# Patient Record
Sex: Male | Born: 1980
Health system: Southern US, Community
[De-identification: ages and names within clinical notes are randomized; demographics above are authoritative.]

## PROBLEM LIST (undated history)

## (undated) DIAGNOSIS — T7840XA Allergy, unspecified, initial encounter: Secondary | ICD-10-CM

## (undated) DIAGNOSIS — F419 Anxiety disorder, unspecified: Secondary | ICD-10-CM

## (undated) DIAGNOSIS — F329 Major depressive disorder, single episode, unspecified: Secondary | ICD-10-CM

## (undated) DIAGNOSIS — F32A Depression, unspecified: Secondary | ICD-10-CM

## (undated) HISTORY — DX: Depression, unspecified: F32.A

## (undated) HISTORY — DX: Allergy, unspecified, initial encounter: T78.40XA

## (undated) HISTORY — DX: Major depressive disorder, single episode, unspecified: F32.9

## (undated) HISTORY — DX: Anxiety disorder, unspecified: F41.9

## (undated) HISTORY — PX: VASECTOMY: SHX75

---

## 2011-11-11 ENCOUNTER — Ambulatory Visit (INDEPENDENT_AMBULATORY_CARE_PROVIDER_SITE_OTHER): Payer: Managed Care, Other (non HMO) | Admitting: Family Medicine

## 2011-11-11 ENCOUNTER — Encounter: Payer: Self-pay | Admitting: Family Medicine

## 2011-11-11 DIAGNOSIS — F329 Major depressive disorder, single episode, unspecified: Secondary | ICD-10-CM

## 2011-11-11 MED ORDER — BUPROPION HCL ER (SR) 150 MG PO TB12: 150.0000 mg | ORAL_TABLET | Freq: Every day | ORAL | Status: DC

## 2011-11-11 NOTE — Patient Instructions (Signed)
I would get a flu shot each fall.   You can call back about getting a flu shot and a tdap (tetanus). Start back on the wellbutrin and if you aren't improved, then let me know.   Glad to see you.  We'll request your records from [facility].

## 2011-11-13 ENCOUNTER — Telehealth: Payer: Self-pay | Admitting: *Deleted

## 2011-11-13 ENCOUNTER — Encounter: Payer: Self-pay | Admitting: Family Medicine

## 2011-11-13 DIAGNOSIS — F329 Major depressive disorder, single episode, unspecified: Secondary | ICD-10-CM | POA: Insufficient documentation
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
## 2011-11-13 NOTE — Telephone Encounter (Signed)
I'll address the hard copy upon return to the office.

## 2011-11-13 NOTE — Telephone Encounter (Signed)
Received a fax from pharmacy requesting additional information before approval will be approved/denied for Bupropion HCL SR.  Form in your IN box.

## 2011-11-13 NOTE — Assessment & Plan Note (Signed)
Requesting records, no SI/HI.  Continue cymbalta, rx given for wellbutrin, 2 copies, 1 for local to get in meantime while mailorder arrives.  Contracts for safety, return for flu/tdap vaccines.  He agrees, will notify me if he doesn't improve on the wellbutrin.  Okay for outpatient f/u.

## 2012-05-06 ENCOUNTER — Ambulatory Visit (INDEPENDENT_AMBULATORY_CARE_PROVIDER_SITE_OTHER): Payer: Managed Care, Other (non HMO) | Admitting: Psychology

## 2012-05-06 DIAGNOSIS — F331 Major depressive disorder, recurrent, moderate: Secondary | ICD-10-CM

## 2012-06-02 ENCOUNTER — Ambulatory Visit (INDEPENDENT_AMBULATORY_CARE_PROVIDER_SITE_OTHER): Payer: Managed Care, Other (non HMO) | Admitting: Family Medicine

## 2012-06-02 ENCOUNTER — Encounter: Payer: Self-pay | Admitting: *Deleted

## 2012-06-02 ENCOUNTER — Encounter: Payer: Self-pay | Admitting: Family Medicine

## 2012-06-02 VITALS — BP 126/78 | HR 80 | Temp 98.1°F | Wt 252.8 lb

## 2012-06-02 DIAGNOSIS — H6092 Unspecified otitis externa, left ear: Secondary | ICD-10-CM

## 2012-06-02 DIAGNOSIS — H60399 Other infective otitis externa, unspecified ear: Secondary | ICD-10-CM

## 2012-06-02 MED ORDER — HYDROCODONE-ACETAMINOPHEN 5-500 MG PO TABS
1.0000 | ORAL_TABLET | Freq: Three times a day (TID) | ORAL | Status: AC | PRN
Start: 2012-06-02 — End: 2012-06-12

## 2012-06-02 NOTE — Progress Notes (Signed)
  Subjective:    Patient ID: Jesse Melton, male    DOB: February 06, 1981, 31 y.o.   MRN: [id number]  HPI CC: L ear pain  sxs started Thursday afternoon - left earache, hearing loss, as well as drainage from ear.  No congestion, RN, sneezing, cough.  + PNDrainage.  + fever to 101.8 last night.  + tinnitus.  Seen at [facility] on Sunday, started on oflox ear drops and amoxicillin 875mg  bid x 10 days.  Told both middle and outer ear infection.  Concern for TM perforation.    Yesterday at work earache got worse - unable to go to second job.  Took old script of klonopin   No h/o ear infections in past.    Has recently been swimming - at [employer] last week.  Washed ear on Saturday with peroxide and water.  Daughter sick recently with cold  No h/o DM.  + mother with h/o DM.  Taking 800mg  ibuprofen and 1gm tylenol  Smoker.  Review of Systems Per HPI    Objective:   Physical Exam  Nursing note and vitals reviewed. Constitutional: He appears well-developed and well-nourished. No distress.  HENT:  Head: Normocephalic and atraumatic.  Right Ear: Hearing, tympanic membrane, external ear and ear canal normal.  Left Ear: Decreased hearing is noted.  Nose: Nose normal. Right sinus exhibits no maxillary sinus tenderness and no frontal sinus tenderness. Left sinus exhibits no maxillary sinus tenderness and no frontal sinus tenderness.  Mouth/Throat: Oropharynx is clear and moist. No oropharyngeal exudate.       L TM unable to fully visualize, some cloudy and erythematous L external ear canal swollen, very tender and with debris present. L outer ear - pinna, tragus tender to palpation and swollen No mastoid tenderness + L posterior auricular tenderness  Eyes: Conjunctivae and EOM are normal. Pupils are equal, round, and reactive to light. No scleral icterus.  Neck: Normal range of motion. Neck supple.  Lymphadenopathy:    He has no cervical adenopathy.       Assessment & Plan:

## 2012-06-02 NOTE — Patient Instructions (Signed)
You have continued external ear infection. Finish oral antibiotic and ear drops - 10 drops into ear daily for 7 days Continue ibuprofen - 800 mg up to 3 times a day. Take vicodin for breakthrough pain - stop tylenol  Otitis Externa Otitis externa ("swimmer's ear") is a germ (bacterial) or fungal infection of the outer ear canal (from the eardrum to the outside of the ear). Swimming in dirty water may cause swimmer's ear. It also may be caused by moisture in the ear from water remaining after swimming or bathing. Often the first signs of infection may be itching in the ear canal. This may progress to ear canal swelling, redness, and pus drainage, which may be signs of infection. HOME CARE INSTRUCTIONS   Apply the antibiotic drops to the ear canal as prescribed by your doctor.   This can be a very painful medical condition. A strong pain reliever may be prescribed.   Only take over-the-counter or prescription medicines for pain, discomfort, or fever as directed by your caregiver.   If your caregiver has given you a follow-up appointment, it is very important to keep that appointment. Not keeping the appointment could result in a chronic or permanent injury, pain, hearing loss and disability. If there is any problem keeping the appointment, you must call back to this facility for assistance.  PREVENTION   It is important to keep your ear dry. Use the corner of a towel to wick water out of the ear canal after swimming or bathing.   Avoid scratching in your ear. This can damage the ear canal or remove the protective wax lining the canal and make it easier for germs (bacteria) or a fungus to grow.   You may use ear drops made of rubbing alcohol and vinegar after swimming to prevent future "swimmer's ear" infections. Make up a small bottle of equal parts white vinegar and alcohol. Put 3 or 4 drops into each ear after swimming.   Avoid swimming in lakes, polluted water, or poorly chlorinated pools.    SEEK MEDICAL CARE IF:   An oral temperature above 102 F (38.9 C) develops.   Your ear is still painful after 3 days and shows signs of getting worse (redness, swelling, pain, or pus).  MAKE SURE YOU:   Understand these instructions.   Will watch your condition.   Will get help right away if you are not doing well or get worse.  Document Released: 11/04/2005 Document Revised: 10/24/2011 Document Reviewed: 06/10/2008 [facility] Patient Information 2012 [place], [place].

## 2012-06-02 NOTE — Assessment & Plan Note (Signed)
Doubt TM perf, however unable to clearly visualize and r/o Continue oral abx to cover AOM, continue ofloxacin for external component. Add on vicodin for pain. Update [place] if not improving as expected.

## 2012-06-04 ENCOUNTER — Ambulatory Visit (INDEPENDENT_AMBULATORY_CARE_PROVIDER_SITE_OTHER): Payer: Managed Care, Other (non HMO) | Admitting: Psychology

## 2012-06-04 DIAGNOSIS — F331 Major depressive disorder, recurrent, moderate: Secondary | ICD-10-CM

## 2012-07-02 ENCOUNTER — Ambulatory Visit: Payer: Managed Care, Other (non HMO) | Admitting: Psychology

## 2012-07-30 ENCOUNTER — Ambulatory Visit (INDEPENDENT_AMBULATORY_CARE_PROVIDER_SITE_OTHER): Payer: Managed Care, Other (non HMO) | Admitting: Psychology

## 2012-07-30 DIAGNOSIS — F331 Major depressive disorder, recurrent, moderate: Secondary | ICD-10-CM

## 2012-08-07 ENCOUNTER — Other Ambulatory Visit: Payer: Self-pay | Admitting: *Deleted

## 2012-08-07 NOTE — Telephone Encounter (Signed)
Faxed refill request   

## 2012-08-09 MED ORDER — BUPROPION HCL ER (SR) 150 MG PO TB12: 150.0000 mg | ORAL_TABLET | Freq: Every day | ORAL | Status: DC

## 2012-08-09 NOTE — Telephone Encounter (Signed)
Need OV this fall.

## 2012-08-10 NOTE — Telephone Encounter (Signed)
LMOVM of cell phone. 

## 2012-08-19 ENCOUNTER — Ambulatory Visit: Payer: Managed Care, Other (non HMO) | Admitting: Psychology

## 2012-08-21 ENCOUNTER — Other Ambulatory Visit: Payer: Self-pay | Admitting: *Deleted

## 2012-08-21 ENCOUNTER — Encounter: Payer: Self-pay | Admitting: Family Medicine

## 2012-08-21 ENCOUNTER — Ambulatory Visit (INDEPENDENT_AMBULATORY_CARE_PROVIDER_SITE_OTHER): Payer: Managed Care, Other (non HMO) | Admitting: Family Medicine

## 2012-08-21 VITALS — BP 142/74 | HR 85 | Temp 97.8°F | Wt 240.0 lb

## 2012-08-21 DIAGNOSIS — Z23 Encounter for immunization: Secondary | ICD-10-CM

## 2012-08-21 DIAGNOSIS — Z0000 Encounter for general adult medical examination without abnormal findings: Secondary | ICD-10-CM | POA: Insufficient documentation

## 2012-08-21 DIAGNOSIS — Z3009 Encounter for other general counseling and advice on contraception: Secondary | ICD-10-CM

## 2012-08-21 DIAGNOSIS — F329 Major depressive disorder, single episode, unspecified: Secondary | ICD-10-CM

## 2012-08-21 NOTE — Patient Instructions (Addendum)
See Shirlee Limerick about your referral before you leave today. Take care.  Don't change your meds.  When you need a refill, notify us (or have the pharmacy send notice).   Recheck in 1 year.  If you want help to stop smoking, notify us.

## 2012-08-21 NOTE — Progress Notes (Signed)
Depressed Mood:  Sleep decreased/ Insomnia/Early awakening: working 3rd shift, but sleep is reasonable Interest decreased in activities: no Guilt or worthlessness:no Energy decreased: only from work and family requirements Concentration difficulties: no Appetite disturbance or weight loss: no Psychomotor retardation/agitation:no Suicidal thoughts: no Overall improved on only wellbutrin.  Is off cymbalta.  Still in counseling with Dr. Laymond Purser, ~1x/month Working 3rd shift at [employer] with a part time second job.    Wanted referral for urology- wants a vasectomy.  Needs a tetanus shot.   Meds, vitals, and allergies reviewed.   ROS: See HPI.  Otherwise negative.    NAD Speech fluent Judgement intact Affect wnl NCAT RRR CTAB No tremor

## 2012-08-21 NOTE — Assessment & Plan Note (Signed)
Referral placed.

## 2012-08-21 NOTE — Assessment & Plan Note (Signed)
Doing well on only wellbutrin.  Continue as is.  Will notify me if his condition changes.  Okay for outpatient f/u.  He is continuing with counseling.

## 2013-01-16 ENCOUNTER — Encounter (HOSPITAL_COMMUNITY): Payer: Self-pay | Admitting: Emergency Medicine

## 2013-01-16 ENCOUNTER — Emergency Department (HOSPITAL_COMMUNITY)
Admission: EM | Admit: 2013-01-16 | Discharge: 2013-01-16 | Disposition: A | Discharge status: Home / Self Care | Payer: Managed Care, Other (non HMO) | Attending: Emergency Medicine | Admitting: Emergency Medicine

## 2013-01-16 DIAGNOSIS — S01312A Laceration without foreign body of left ear, initial encounter: Secondary | ICD-10-CM

## 2013-01-16 DIAGNOSIS — S01309A Unspecified open wound of unspecified ear, initial encounter: Secondary | ICD-10-CM | POA: Insufficient documentation

## 2013-01-16 DIAGNOSIS — Y9289 Other specified places as the place of occurrence of the external cause: Secondary | ICD-10-CM | POA: Insufficient documentation

## 2013-01-16 DIAGNOSIS — Y990 Civilian activity done for income or pay: Secondary | ICD-10-CM | POA: Insufficient documentation

## 2013-01-16 DIAGNOSIS — Y939 Activity, unspecified: Secondary | ICD-10-CM | POA: Insufficient documentation

## 2013-01-16 NOTE — ED Notes (Signed)
Pt states earring from L ear became lodge in car door while at work, earring pulled out of ear, laceration noted through earlobe

## 2013-01-16 NOTE — ED Provider Notes (Signed)
History     CSN: [id number]  Arrival date & time 01/16/13  0056   First MD Initiated Contact with Patient 01/16/13 0107      Chief Complaint  Patient presents with  . Ear Laceration    (Consider location/radiation/quality/duration/timing/severity/associated sxs/prior treatment) HPI History provided by pt.   Pt caught his earring on a car door at work, and it tore out through his left earlobe.  Occurred just pta.  Bleed heavily initially but currently controlled.  Pain is minimal.  Tetanus is up to date.   Past Medical History  Diagnosis Date  . Allergy   . Depression     much improved ~2012 with wellbutrin    History reviewed. No pertinent past surgical history.  Family History  Problem Relation Age of Onset  . Heart disease Mother   . Mental illness Mother   . Diabetes Mother   . Heart disease Father   . Colon cancer Neg Hx   . Prostate cancer Neg Hx     History  Substance Use Topics  . Smoking status: Current Some Day Smoker    Types: Cigars  . Smokeless tobacco: Current User    Types: Snuff  . Alcohol Use: Yes     Comment: Occasional      Review of Systems  All other systems reviewed and are negative.    Allergies  Review of patient's allergies indicates no known allergies.  Home Medications   Current Outpatient Rx  Name  Route  Sig  Dispense  Refill  . buPROPion (WELLBUTRIN SR) 150 MG 12 hr tablet   Oral   Take 1 tablet (150 mg total) by mouth daily.   90 tablet   1     BP 143/82  Pulse 71  Temp(Src) 98.3 F (36.8 C) (Oral)  Resp 20  Ht 5\' 10"  (1.778 m)  Wt 235 lb (106.595 kg)  BMI 33.72 kg/m2  SpO2 100%  Physical Exam  Nursing note and vitals reviewed. Constitutional: He is oriented to person, place, and time. He appears well-developed and well-nourished. No distress.  HENT:  Head: Normocephalic and atraumatic.  1.5cm hemostatic and clean lac through the left earlobe.    Eyes:  Normal appearance  Neck: Normal range of motion.   Pulmonary/Chest: Effort normal.  Musculoskeletal: Normal range of motion.  Neurological: He is alert and oriented to person, place, and time.  Psychiatric: He has a normal mood and affect. His behavior is normal.    ED Course  Procedures (including critical care time)  LACERATION REPAIR Performed by: Otilio Miu Authorized by: Ruby Cola E Consent: Verbal consent obtained. Risks and benefits: risks, benefits and alternatives were discussed Consent given by: patient Patient identity confirmed: provided demographic data Prepped and Draped in normal sterile fashion Wound explored  Laceration Location: left earlobe  Laceration Length: 2.5cm  No Foreign Bodies seen or palpated  Anesthesia: local infiltration  Local anesthetic: lidocaine 2% w/out epinephrine  Anesthetic total: 3 ml  Irrigation method: syringe Amount of cleaning: standard  Skin closure: prolene 6.0  Number of sutures: 8  Technique: simple interrupted  Patient tolerance: Patient tolerated the procedure well with no immediate complications.  Labs Reviewed - No data to display No results found.   1. Laceration of ear, left, initial encounter       MDM  31yo M presents w/ lac of left earlobe.  Nursing staff cleaned and I sutured.  Tetanus up to date.  Return precautions discussed. 2:55 AM  Otilio Miu, PA-C 01/16/13 [phone]

## 2013-01-16 NOTE — ED Provider Notes (Signed)
Medical screening examination/treatment/procedure(s) were performed by non-physician practitioner and as supervising physician I was immediately available for consultation/collaboration.   Amand Lemoine, MD 01/16/13 0550 

## 2013-03-01 ENCOUNTER — Other Ambulatory Visit: Payer: Self-pay | Admitting: Family Medicine

## 2013-03-01 NOTE — Telephone Encounter (Signed)
Electronic refill request.  This does not fit protocol for me to refill.  Please advise.

## 2013-03-01 NOTE — Telephone Encounter (Signed)
Okay to continue.  Sent.  

## 2013-09-02 ENCOUNTER — Other Ambulatory Visit: Payer: Self-pay | Admitting: Family Medicine

## 2013-09-02 NOTE — Telephone Encounter (Signed)
Electronic refill request. Patient not seen in some time.  Please advise.

## 2013-09-02 NOTE — Telephone Encounter (Signed)
Needs OV.  

## 2013-09-03 NOTE — Telephone Encounter (Signed)
Left detailed message on voicemail.  

## 2013-12-07 ENCOUNTER — Other Ambulatory Visit: Payer: Self-pay | Admitting: Family Medicine

## 2013-12-07 NOTE — Telephone Encounter (Signed)
Electronic refill request. Patient has not been seen since 2013.  Please advise.

## 2013-12-08 ENCOUNTER — Telehealth: Payer: Self-pay

## 2013-12-08 NOTE — Telephone Encounter (Signed)
Deny, needs OV first.

## 2013-12-08 NOTE — Telephone Encounter (Signed)
Opened in error--request already sent

## 2013-12-08 NOTE — Telephone Encounter (Deleted)
Last filled 09/03/13 #90--please advise

## 2013-12-10 ENCOUNTER — Encounter: Payer: Self-pay | Admitting: Family Medicine

## 2013-12-10 ENCOUNTER — Ambulatory Visit (INDEPENDENT_AMBULATORY_CARE_PROVIDER_SITE_OTHER): Payer: Managed Care, Other (non HMO) | Admitting: Family Medicine

## 2013-12-10 VITALS — BP 108/72 | HR 76 | Temp 98.1°F | Wt 233.5 lb

## 2013-12-10 DIAGNOSIS — F329 Major depressive disorder, single episode, unspecified: Secondary | ICD-10-CM

## 2013-12-10 MED ORDER — BUPROPION HCL ER (SR) 150 MG PO TB12: 150.0000 mg | ORAL_TABLET | Freq: Every day | ORAL | Status: DC

## 2013-12-10 NOTE — Patient Instructions (Signed)
Take care. Don't change your meds.  Call back as needed.

## 2013-12-10 NOTE — Progress Notes (Signed)
Pre-visit discussion using our clinic review tool. No additional management support is needed unless otherwise documented below in the visit note.  MDD prev on wellbutrin.  "It's the only thing that ever worked continually.  I don't want to stop it."  Not in counseling now, had done some prev.  It did help some previously.  Working 3rd shift, sleeping well.  Concentration, attention wnl.  No SI/HI, not tearful.  Occ "bad days" but not SI/HI and much improved on meds.  No ADE.  Using E cig.  Etoh, occ, on the weekends.  No drugs.  Occ takes a second dose on long work days.   Meds, vitals, and allergies reviewed.   ROS: See HPI.  Otherwise, noncontributory.

## 2013-12-10 NOTE — Assessment & Plan Note (Signed)
Stable, doing well.  Continue as is.  F/u prn.  No indication to change med.

## 2014-02-04 ENCOUNTER — Telehealth: Payer: Self-pay

## 2014-02-04 NOTE — Telephone Encounter (Signed)
Pt said when picked up bupropion in 11/2013 pt just realized quantity was # 60. Pt wanted to get #120. [facility] he can refill # 120. Pt notified and voiced understanding.

## 2014-12-27 ENCOUNTER — Other Ambulatory Visit: Payer: Self-pay

## 2014-12-27 NOTE — Telephone Encounter (Signed)
Pt left v/m requesting refill bupropion for 90 day supply to [facility]. Pt last seen 12/10/13 and has appt for med refill scheduled on 12/30/14.pt will run out before appt on 12/30/14.Please advise. Pt request cb.

## 2014-12-28 MED ORDER — BUPROPION HCL ER (SR) 150 MG PO TB12: 150.0000 mg | ORAL_TABLET | Freq: Every day | ORAL | Status: DC

## 2014-12-28 NOTE — Telephone Encounter (Signed)
Sent, thanks

## 2014-12-28 NOTE — Telephone Encounter (Signed)
Left message on voice mail  to call back

## 2014-12-28 NOTE — Telephone Encounter (Signed)
Patient notified by telephone that script has been sent to the pharmacy. 

## 2014-12-30 ENCOUNTER — Encounter: Payer: Self-pay | Admitting: Family Medicine

## 2014-12-30 ENCOUNTER — Ambulatory Visit (INDEPENDENT_AMBULATORY_CARE_PROVIDER_SITE_OTHER): Payer: Managed Care, Other (non HMO) | Admitting: Family Medicine

## 2014-12-30 VITALS — BP 104/70 | HR 74 | Temp 97.7°F | Wt 221.2 lb

## 2014-12-30 DIAGNOSIS — F3342 Major depressive disorder, recurrent, in full remission: Secondary | ICD-10-CM

## 2014-12-30 MED ORDER — BUPROPION HCL ER (SR) 150 MG PO TB12: 150.0000 mg | ORAL_TABLET | Freq: Every day | ORAL | Status: DC

## 2014-12-30 NOTE — Assessment & Plan Note (Signed)
Doing well, continue as is.  F/u prn.  He agrees.

## 2014-12-30 NOTE — Patient Instructions (Signed)
Take care.  Glad to see you.  I would get a flu shot each fall.   Call me if needed.  I'd like to see you back in about 1 year.

## 2014-12-30 NOTE — Progress Notes (Signed)
Pre visit review using our clinic review tool, if applicable. No additional management support is needed unless otherwise documented below in the visit note.  MDD prev on wellbutrin. "It's the only thing that ever worked continually. I don't want to stop it." Had done counseling prev. It did help some previously. Working 3rd shift, sleeping well. Concentration, attention wnl. No SI/HI, not tearful. Rare "bad days" but not SI/HI and much improved on meds. No ADE. Using E cig, tapering off. Etoh, occ, on the weekends. No drugs. Occ takes a second dose on long work days, usually about 1-2 times a week.    Meds, vitals, and allergies reviewed.   ROS: See HPI.  Otherwise, noncontributory.  GEN: nad, alert and oriented, speech and affect wnl.  HEENT: mucous membranes moist NECK: supple w/o LA CV: rrr.  no murmur PULM: ctab, no inc wob ABD: soft, +bs EXT: no edema

## 2015-08-17 ENCOUNTER — Other Ambulatory Visit: Payer: Self-pay

## 2015-08-17 NOTE — Telephone Encounter (Addendum)
Pt left v/m requesting change in instructions and quantity of wellbutrin. Pt has recently started day shift and pt needs to take the wellbutrin SR 150 mg twice a day. Pt request instructions changed to bid and quantity changed to # 180.Please advise. Pt last seen 12/30/14.  Pt request cb when refill done.Unable to reach pt to verify what pharmacy pt wants med to go to. Pt called back and request med to [facility].

## 2015-08-18 MED ORDER — BUPROPION HCL ER (SR) 150 MG PO TB12: 150.0000 mg | ORAL_TABLET | Freq: Two times a day (BID) | ORAL | Status: DC

## 2015-08-18 NOTE — Telephone Encounter (Signed)
Sent. Thanks.   

## 2015-08-18 NOTE — Telephone Encounter (Signed)
Pt called back to ck on status of refill; spoke with [facility] and refill ready for pick up; apologized to pt he did not get cb but rx ready now. Pt voiced understanding.

## 2016-02-17 ENCOUNTER — Other Ambulatory Visit: Payer: Self-pay | Admitting: Family Medicine

## 2016-02-19 NOTE — Telephone Encounter (Signed)
Received refill electronically Last refill 08/18/15 #180/1 refill Last office visit 12/30/14 Is it okay to refill?

## 2016-02-19 NOTE — Telephone Encounter (Signed)
Sent.  Needs 15 min OV when possible.  Thanks.

## 2016-02-20 NOTE — Telephone Encounter (Signed)
Left detailed message on voicemail.  

## 2016-05-16 ENCOUNTER — Other Ambulatory Visit: Payer: Self-pay | Admitting: Family Medicine

## 2016-05-16 NOTE — Telephone Encounter (Signed)
Electronic refill request. Last office visit:   12/30/2014.  Last Filled:    180 tablet 0 02/19/2016  Please advise.  No upcoming appts scheduled.

## 2016-05-16 NOTE — Telephone Encounter (Signed)
Needs OV when possible.  Sent.  Thanks.

## 2016-05-17 NOTE — Telephone Encounter (Signed)
Left detailed message on voicemail.  

## 2016-08-12 ENCOUNTER — Other Ambulatory Visit: Payer: Self-pay | Admitting: Family Medicine

## 2016-11-07 ENCOUNTER — Other Ambulatory Visit: Payer: Self-pay | Admitting: Family Medicine

## 2016-11-07 NOTE — Telephone Encounter (Signed)
Spoke to pt. Made OV 12-13-16.

## 2016-12-13 ENCOUNTER — Ambulatory Visit (INDEPENDENT_AMBULATORY_CARE_PROVIDER_SITE_OTHER): Payer: Managed Care, Other (non HMO) | Admitting: Family Medicine

## 2016-12-13 ENCOUNTER — Encounter: Payer: Self-pay | Admitting: Family Medicine

## 2016-12-13 DIAGNOSIS — F3342 Major depressive disorder, recurrent, in full remission: Secondary | ICD-10-CM | POA: Diagnosis not present

## 2016-12-13 MED ORDER — BUPROPION HCL ER (SR) 150 MG PO TB12: ORAL_TABLET | ORAL | 0 refills | Status: DC

## 2016-12-13 NOTE — Progress Notes (Signed)
Pre visit review using our clinic review tool, if applicable. No additional management support is needed unless otherwise documented below in the visit note. 

## 2016-12-13 NOTE — Progress Notes (Signed)
F/u depression.  On wellbutrin, with relief of mood sx.  Sleeping well.  Exercise- running and lifting.  He has some dec in concentration about 1-2 hours after a dose of wellbutrin.  Noted in the last year or so.  Asking about options.  No SI/HI.    His daughter has anxiety and is in counseling, with some relief.    Meds, vitals, and allergies reviewed.   ROS: Per HPI unless specifically indicated in ROS section   GEN: nad, alert and oriented HEENT: mucous membranes moist NECK: supple w/o LA CV: rrr.  no murmur PULM: ctab, no inc wob ABD: soft, +bs EXT: no edema

## 2016-12-13 NOTE — Patient Instructions (Signed)
Let me consider options about the wellbutrin and we'll be in touch.   Take care.  Glad to see you.

## 2016-12-15 ENCOUNTER — Telehealth: Payer: Self-pay | Admitting: Family Medicine

## 2016-12-15 MED ORDER — BUPROPION HCL 100 MG PO TABS: 100.0000 mg | ORAL_TABLET | Freq: Three times a day (TID) | ORAL | 1 refills | Status: DC

## 2016-12-15 NOTE — Assessment & Plan Note (Signed)
No SI/HI but noting changes in concentration after med dose.  D/w pt.   We can consider med change vs dose change/reduction, ie wellbutrin 100mg  in the AM then 150mg  in the PM vs 100 tid.   Had prev used prozac, zoloft, cymbalta w/o relief.   Never on venlafaxine prev.   I want to consider.  >25 minutes spent in face to face time with patient, >50% spent in counselling or coordination of care.  See following note.

## 2016-12-15 NOTE — Telephone Encounter (Signed)
Call pt.  Did some consideration.  Would try 100mg  wellbutrin TID, rx sent.  Try that and update me in about 1-2 weeks, sooner if needed.  We'll go from there.  Thanks.

## 2016-12-16 NOTE — Telephone Encounter (Signed)
Left message on voice mail  to call back

## 2016-12-16 NOTE — Telephone Encounter (Signed)
Patient notified as instructed by telephone and verbalized understanding. 

## 2017-01-17 ENCOUNTER — Other Ambulatory Visit: Payer: Self-pay

## 2017-01-17 MED ORDER — BUPROPION HCL 100 MG PO TABS: 100.0000 mg | ORAL_TABLET | Freq: Three times a day (TID) | ORAL | 3 refills | Status: DC

## 2017-01-17 NOTE — Telephone Encounter (Signed)
Pt called checking on rx  He stated he is out of his meds Please advise

## 2017-01-17 NOTE — Telephone Encounter (Signed)
Pt left v/m due to ins guidelines pt needs to get a 90 day rx for bupropion 100 mg sent to [facility]. Last seen 12/13/16. Last refilled # 90 x 1 on 12/15/16.No future appt scheduled.Please advise.

## 2017-01-17 NOTE — Telephone Encounter (Signed)
Called pt.  Doing well with current med/dose and we wanted to continue.  Clearly an improvement from prev.  Rx sent.  He'll update me as needed.

## 2017-02-11 ENCOUNTER — Ambulatory Visit: Payer: Managed Care, Other (non HMO) | Admitting: Family Medicine

## 2017-12-31 ENCOUNTER — Ambulatory Visit: Payer: 59 | Admitting: Family Medicine

## 2017-12-31 ENCOUNTER — Encounter: Payer: Self-pay | Admitting: Family Medicine

## 2017-12-31 VITALS — BP 120/88 | HR 66 | Temp 98.5°F | Wt 243.0 lb

## 2017-12-31 DIAGNOSIS — F3342 Major depressive disorder, recurrent, in full remission: Secondary | ICD-10-CM | POA: Diagnosis not present

## 2017-12-31 DIAGNOSIS — R300 Dysuria: Secondary | ICD-10-CM | POA: Diagnosis not present

## 2017-12-31 LAB — POCT URINALYSIS DIPSTICK
BILIRUBIN UA: NEGATIVE
Glucose, UA: NEGATIVE
KETONES UA: NEGATIVE
Leukocytes, UA: NEGATIVE
NITRITE UA: NEGATIVE
PH UA: 6 (ref 5.0–8.0)
Protein, UA: NEGATIVE
RBC UA: NEGATIVE
SPEC GRAV UA: 1.025 (ref 1.010–1.025)
UROBILINOGEN UA: 0.2 U/dL

## 2017-12-31 MED ORDER — BUPROPION HCL 100 MG PO TABS: 100.0000 mg | ORAL_TABLET | Freq: Three times a day (TID) | ORAL | 3 refills | Status: DC

## 2017-12-31 MED ORDER — SULFAMETHOXAZOLE-TRIMETHOPRIM 400-80 MG PO TABS: 1.0000 | ORAL_TABLET | Freq: Two times a day (BID) | ORAL | 0 refills | Status: DC

## 2017-12-31 NOTE — Progress Notes (Signed)
His daughter has anxiety and is in counseling, with some relief.    Still on wellbutrin at baseline and he is doing better on the current dose of 100mg  TID.  He didn't tolerate the 150mg  dosing as well, see prev notes.  He is soon to get a promotion at work and that is a good thing for him.  No SI/HI.   When he was 37 y/o he had a prostate infection and was on abx for 4 weeks, he was told to expect return of sx at some point.  He has had episodic return of sx, every year or two.  He would put up with it and it would eventually resolve.  He is on week 5 now with this flare.  Burning with urination.  Urgency.  No FCNAV.  He doesn't have as much pain when he isn't urinating.    Some occ R testicle pain.  No discharge.  Monogamous.  Prev neg STD testing.  Cutting out caffeine helped minimally.    Meds, vitals, and allergies reviewed.   ROS: Per HPI unless specifically indicated in ROS section   nad ncat Mmm Neck supple, no LA rrr ctab abd soft, not ttp, no cva pain Ext w/o edema.  Prostate not ttp  Testes bilaterally descended without nodularity, tenderness or masses. No scrotal masses or lesions. No penis lesions or urethral discharge. Affect wnl.

## 2017-12-31 NOTE — Patient Instructions (Signed)
Presumed non-severe prostatitis.  Start septra. We'll contact you with your lab report. Take care.  Glad to see you.  Update me if not getting better or if worse.

## 2017-12-31 NOTE — Assessment & Plan Note (Signed)
Controlled, continue current medication.  Continue as is.  He will update me as needed.  No suicidal or homicidal intent.

## 2017-12-31 NOTE — Assessment & Plan Note (Signed)
Check urine culture.  He has reportedly had episodes like this over the years.  He reports previously being diagnosed with prostatitis.  His rectal exam is unremarkable at this point.  The prostate is not boggy and not tender.  He is monogamous with negative STD testing previously.  Given his history it probably is reasonable to go ahead and treat him.  Discussed.  I question if he could have subclinical symptoms that were causing dysuria but not causing frank tenderness on rectal exam.  He will update me if not getting better.  At this point still okay for outpatient follow-up.  No CVA pain.  No fever.

## 2018-01-01 LAB — URINE CULTURE
MICRO NUMBER: 90193447
Result:: NO GROWTH
SPECIMEN QUALITY:: ADEQUATE

## 2019-01-01 DIAGNOSIS — J029 Acute pharyngitis, unspecified: Secondary | ICD-10-CM | POA: Diagnosis not present

## 2019-01-01 DIAGNOSIS — H6501 Acute serous otitis media, right ear: Secondary | ICD-10-CM | POA: Diagnosis not present

## 2019-01-06 ENCOUNTER — Other Ambulatory Visit: Payer: Self-pay | Admitting: Family Medicine

## 2019-01-07 NOTE — Telephone Encounter (Signed)
Electronic refill request. Bupropion Last office visit:   12/31/17 Acute, prior to that 12/13/16 Last Filled:    270 tablet 3 12/31/2017  Please advise.

## 2019-01-08 NOTE — Telephone Encounter (Signed)
Patient advised.

## 2019-01-08 NOTE — Telephone Encounter (Signed)
Please schedule OV when possible, after flu season is fine with me.  Sent.  Thanks.

## 2019-06-08 ENCOUNTER — Ambulatory Visit: Payer: BC Managed Care – PPO | Admitting: Family Medicine

## 2019-06-08 ENCOUNTER — Other Ambulatory Visit: Payer: Self-pay

## 2019-06-08 ENCOUNTER — Encounter: Payer: Self-pay | Admitting: Family Medicine

## 2019-06-08 VITALS — BP 110/74 | HR 83 | Temp 98.7°F | Ht 70.0 in | Wt 227.4 lb

## 2019-06-08 DIAGNOSIS — F339 Major depressive disorder, recurrent, unspecified: Secondary | ICD-10-CM

## 2019-06-08 DIAGNOSIS — R4586 Emotional lability: Secondary | ICD-10-CM | POA: Diagnosis not present

## 2019-06-08 NOTE — Progress Notes (Signed)
Pandemic consideration d/w pt.  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  He is full time now.    Mood d/w pt.  Still on wellbutrin at baseline which had prev helped.  No ADE on short acting med, but he did not tolerate the sustained release formulation as well.  His stress level is higher in the last few months given all of the recent changes.  His mood is worse given the stressors.  He has mood elevations, with following lowering of mood.  No SI/HI.    He doesn't have a firm dx of BAD but with elevations in mood he'll have about 1 day of elevated energy, lack of sleep, spending money.  Followed by ~3 days with much lower mood.  These episodes have been happening about once a week.    His child Addie is nonbinary and she is seeing a Social worker about that.   FH- mother is bipolar.    No psychotic thoughts, etc.  No illicits.  Some etoh on the weekends, 4 drinks a day on the weekends.    Prev was on prozac and zoloft and cymbalta, without relief.  PMH and SH reviewed  ROS: Per HPI unless specifically indicated in ROS section   Meds, vitals, and allergies reviewed.   GEN: nad, alert and oriented HEENT:ncat NECK: supple w/o LA CV: rrr.  PULM: ctab, no inc wob ABD: soft, +bs EXT: no edema SKIN: no acute rash Speech and judgment normal.  Affect is slightly flat.

## 2019-06-08 NOTE — Patient Instructions (Signed)
Go to the lab on the way out.  We'll contact you with your lab report. Let me see about options in the meantime for a med along with the wellbutrin.  We'll be in touch.  We'll see about getting you set up with psychiatry.   Take care.  Glad to see you.

## 2019-06-09 LAB — CBC WITH DIFFERENTIAL/PLATELET
Basophils Absolute: 0.1 10*3/uL (ref 0.0–0.1)
Basophils Relative: 1.2 % (ref 0.0–3.0)
Eosinophils Absolute: 0.1 10*3/uL (ref 0.0–0.7)
Eosinophils Relative: 1.4 % (ref 0.0–5.0)
HCT: 44.6 % (ref 39.0–52.0)
Hemoglobin: 15.1 g/dL (ref 13.0–17.0)
Lymphocytes Relative: 33.6 % (ref 12.0–46.0)
Lymphs Abs: 2.3 10*3/uL (ref 0.7–4.0)
MCHC: 33.7 g/dL (ref 30.0–36.0)
MCV: 89.9 fl (ref 78.0–100.0)
Monocytes Absolute: 0.6 10*3/uL (ref 0.1–1.0)
Monocytes Relative: 8.5 % (ref 3.0–12.0)
Neutro Abs: 3.7 10*3/uL (ref 1.4–7.7)
Neutrophils Relative %: 55.3 % (ref 43.0–77.0)
Platelets: 215 10*3/uL (ref 150.0–400.0)
RBC: 4.96 Mil/uL (ref 4.22–5.81)
RDW: 12.7 % (ref 11.5–15.5)
WBC: 6.8 10*3/uL (ref 4.0–10.5)

## 2019-06-09 LAB — COMPREHENSIVE METABOLIC PANEL
ALT: 16 U/L (ref 0–53)
AST: 13 U/L (ref 0–37)
Albumin: 4.4 g/dL (ref 3.5–5.2)
Alkaline Phosphatase: 52 U/L (ref 39–117)
BUN: 12 mg/dL (ref 6–23)
CO2: 29 mEq/L (ref 19–32)
Calcium: 9.4 mg/dL (ref 8.4–10.5)
Chloride: 106 mEq/L (ref 96–112)
Creatinine, Ser: 1.24 mg/dL (ref 0.40–1.50)
GFR: 65.95 mL/min (ref >60.00)
Glucose, Bld: 80 mg/dL (ref 70–99)
Potassium: 4.1 mEq/L (ref 3.5–5.1)
Sodium: 142 mEq/L (ref 135–145)
Total Bilirubin: 0.7 mg/dL (ref 0.2–1.2)
Total Protein: 6.6 g/dL (ref 6.0–8.3)

## 2019-06-09 LAB — TSH: TSH: 1.93 u[IU]/mL (ref 0.35–4.50)

## 2019-06-10 ENCOUNTER — Other Ambulatory Visit: Payer: Self-pay | Admitting: Family Medicine

## 2019-06-10 DIAGNOSIS — R4586 Emotional lability: Secondary | ICD-10-CM

## 2019-06-10 MED ORDER — BUPROPION HCL 100 MG PO TABS: 100.0000 mg | ORAL_TABLET | Freq: Three times a day (TID) | ORAL | 1 refills | Status: DC

## 2019-06-10 MED ORDER — QUETIAPINE FUMARATE 25 MG PO TABS: 25.0000 mg | ORAL_TABLET | Freq: Every day | ORAL | 0 refills | Status: DC

## 2019-06-10 MED ORDER — QUETIAPINE FUMARATE 25 MG PO TABS: 25.0000 mg | ORAL_TABLET | Freq: Every day | ORAL | Status: DC

## 2019-06-10 NOTE — Assessment & Plan Note (Signed)
He has been treated in the past as unilateral depression with relief with Wellbutrin.  He does have a family history of bipolar disorder.  His stress level is a lot higher given the pandemic, his family situation, etc.  We talked about tapering alcohol on the weekends.  He is not using illicits.  No suicidal homicidal intent.  Okay for outpatient follow-up.  He does have fluctuating mood and I am uncertain if he truly does have BAD.  I want to check his labs in the meantime and see about options.  We talked about potential psychiatry referral.  He agreed with the plan.  See follow-up notes and messaging.  >25 minutes spent in face to face time with patient, >50% spent in counselling or coordination of care.

## 2019-06-16 ENCOUNTER — Telehealth: Payer: Self-pay

## 2019-06-16 MED ORDER — QUETIAPINE FUMARATE 25 MG PO TABS: 50.0000 mg | ORAL_TABLET | Freq: Every day | ORAL | Status: DC

## 2019-06-16 NOTE — Telephone Encounter (Signed)
Pt left v/m not sure if can see any changes yet since taking med (pt did not name the med but ? Seroquel). Pt not had any adverse side effects and would like to stay on med for 1 -2 more weeks to see how he does.Please advise. Pt seen 06-08-19.

## 2019-06-16 NOTE — Telephone Encounter (Signed)
I am glad he was able to tolerate the medication.  I intentionally started him on a low dose of the medicine.  I would try going up to 2 tablets at night and see if he notices a difference with that.  I will also send a note to the referral coordinators to see about his appointment with psychiatry.  Thanks.

## 2019-06-17 NOTE — Telephone Encounter (Signed)
Left message for [place] to return call to office.

## 2019-06-22 ENCOUNTER — Telehealth: Payer: Self-pay | Admitting: Family Medicine

## 2019-06-22 NOTE — Telephone Encounter (Signed)
-----   Message from Haynes Bast sent at 06/18/2019  3:04 PM EDT ----- Donnald Garre called your patient twice so far and he hasnt returned my calls. I faxed over the Referral to Dr Varney Biles office but havent spoke to the patient yet. Rosaria Ferries ----- Message ----- From: Tonia Ghent, MD Sent: 06/16/2019   9:04 PM EDT To: Haynes Bast  When can patient see psychiatry?  Please let me know.  Thanks.  Brigitte Pulse

## 2019-06-22 NOTE — Telephone Encounter (Signed)
Pt aware of recs per Dr Damita Dunnings Pt states that he is tolerating the current dose well and has noticed a little difference. Pt is going to increase to 2 tablets nightly and see how he tolerates, he will call if any new issues or side effects arise.  He is okay with being referred to Psychiatry  Will send to Dr Damita Dunnings as Juluis Rainier

## 2019-06-23 NOTE — Telephone Encounter (Signed)
Noted. Thanks.  Referral is in.

## 2019-06-25 ENCOUNTER — Telehealth: Payer: Self-pay

## 2019-06-25 MED ORDER — ARIPIPRAZOLE 5 MG PO TABS: 5.0000 mg | ORAL_TABLET | Freq: Every day | ORAL | 0 refills | Status: DC

## 2019-06-25 NOTE — Telephone Encounter (Signed)
Pt said the seroquel 25 mg taking one pill at hs. Pt said dragging pt down so tired he has trouble functioning at work. Pt drinking large quantities of caffeine to function. Last night it affected pt to the point he could not do his job. NO SI/HI. When first started taking the seroquel it helped pt but not pt is so tired and for past wk pt is depressed. [facility] is talking with pt now about psych referral. Pt wants to know what he should do now. Dr Damita Dunnings out of office so sending to Dr Danise Mina.

## 2019-06-25 NOTE — Telephone Encounter (Signed)
Pt notified as instructed and pt voiced understanding and will pick up abilify and stop the seroquel 25 mg which pt was taking one tab at hs. Pt will cb and stop abilify if any SI/HI. FYI to Dr Damita Dunnings and Dr Danise Mina.

## 2019-06-25 NOTE — Telephone Encounter (Signed)
[place] Night - Client Nonclinical Telephone Record [facility] Client [place] Night - Client Client Site [facility] Physician Renford Dills - MD Contact Type Call Who Is Calling Patient / Member / Family / Caregiver Caller Name Adarryl Goldammer Caller Phone Number [phone] Patient Name Nihal Marzella Patient DOB [birth year] Call Type Message Only Information Provided Reason for Call Request for General Office Information Initial Comment Caller states he started a new medication, not working for him, and wondering what to do next, asking to speak with the MD. Refused triage. Additional Comment Provided information for a call back from the office. Call Closed By: Rosana Fret Transaction Date/Time: 06/25/2019 7:52:13 AM (ET)

## 2019-06-25 NOTE — Telephone Encounter (Signed)
Chart reviewed. Please verify he's taking 1 tablet of seroquel 25mg  nightly.  If so, stop seroquel if overly sedating on just 1 tab nightly (25mg  is the lowest dose available). In its place may try abilify 5mg  daily sent to pharmacy. If any SI/HI, stop med and let us know immediately.  Will cc PCP as Juluis Rainier

## 2019-06-27 NOTE — Telephone Encounter (Signed)
Agree, thanks.  Please get update on patient on Monday.   When can he see psychiatry?

## 2019-06-27 NOTE — Addendum Note (Signed)
Addended by: Tonia Ghent on: 06/27/2019 10:11 PM   Modules accepted: Orders

## 2019-06-28 NOTE — Telephone Encounter (Signed)
Feels better. Feel tired still but not like it was before medication change. No suicidal thoughts per patient. Appointment is set up for October with Dr. Johnney Killian was the first available appointment. Patient advised to let us know if he needs [place] in the meantime.

## 2019-06-29 NOTE — Telephone Encounter (Signed)
Noted. Thanks.

## 2019-07-17 ENCOUNTER — Other Ambulatory Visit: Payer: Self-pay | Admitting: Family Medicine

## 2019-07-19 NOTE — Telephone Encounter (Signed)
LOV 06/08/2019, no future appointments. Last filled on 06/25/2019 with no additional refills. Pharmacy sent request for 90 day supply if appropriate.

## 2019-07-20 NOTE — Telephone Encounter (Signed)
Spoke with patient. Patient states he is doing a lot better. Exercises regularly, takes this medication before bedtime. Overall doing good. Medication is managing symptoms at this time.

## 2019-07-20 NOTE — Telephone Encounter (Signed)
Sent. Thanks.  °Please get update on patient.   °

## 2019-07-21 NOTE — Telephone Encounter (Signed)
Noted. Thanks.

## 2019-08-02 ENCOUNTER — Telehealth: Payer: Self-pay

## 2019-08-02 DIAGNOSIS — Z1159 Encounter for screening for other viral diseases: Secondary | ICD-10-CM | POA: Diagnosis not present

## 2019-08-02 NOTE — Telephone Encounter (Signed)
[place] Night - Client TELEPHONE ADVICE RECORD [facility]urse Patient Name: Jesse Melton Gender: Male DOB: Jan 05, 1981 Age: 38 Y 2 M 7 D Return Phone Number: [phone] (Primary), [phone] (Secondary) [place] [address] Client [place] Night - Client Client Site [place] Physician Renford Dills - MD Contact Type Call Who Is Calling Patient / Member / Family / Caregiver Call Type Triage / Clinical Relationship To Patient Self Return Phone Number [phone] (Primary) Chief Complaint Vomiting Reason for Call Symptomatic / Request for [place] has been running a fever today along with vomiting and diarrhea, temp is currently 101.6, States he needs to get tested for work Translation No Nurse Assessment Nurse: Fredderick Phenix, RN, Lelan Pons Date/Time ([place] Time): 08/01/2019 12:17:31 PM Confirm and document reason for call. If symptomatic, describe symptoms. ---Caller that starting in the night, has vomiting 2x and diarrhea hourly, temp is currently 101.6 temporal. Very little urine, cant drink water. Has the patient had close contact with a person known or suspected to have the novel coronavirus illness OR traveled / lives in area with major community spread (including international travel) in the last 14 days from the onset of symptoms? * If Asymptomatic, screen for exposure and travel within the last 14 days. ---No Does the patient have any new or worsening symptoms? ---Yes Will a triage be completed? ---Yes Related visit to physician within the last 2 weeks? ---No Does the PT have any chronic conditions? (i.e. diabetes, asthma, this includes High risk factors for pregnancy, etc.) ---No Is this a behavioral health or substance abuse call? ---No Guidelines Guideline Title Affirmed Question Affirmed Notes Nurse Date/Time  (Eastern Time) Diarrhea Fever > 101 F (38.3 C) Ehlers, RN, Lelan Pons 08/01/2019 12:22:54 PM Disp. Time Eilene Ghazi Time) Disposition Final User 08/01/2019 12:27:32 PM See PCP within 24 Hours Yes Fredderick Phenix, RN, Lelan Pons PLEASE NOTE: All timestamps contained within this report are represented as [place] Standard Time. CONFIDENTIALTY NOTICE: This fax transmission is intended only for the addressee. It contains information that is legally privileged, confidential or otherwise protected from use or disclosure. If you are not the intended recipient, you are strictly prohibited from reviewing, disclosing, copying using or disseminating any of this information or taking any action in reliance on or regarding this information. If you have received this fax in error, please notify us immediately by telephone so that we can arrange for its return to [place]. Phone: [phone], Toll-Free: [phone], Fax: [phone] Page: 2 of 2 Call Id: [id number] [place] Disagree/Comply Comply Caller Understands Yes PreDisposition Did not know what to do Care Advice Given Per Guideline SEE PCP WITHIN 24 HOURS: * IF OFFICE WILL BE OPEN: You need to be seen within the next 24 hours. Call your doctor (or NP/PA) when the office opens and make an appointment. STOOL SAMPLE: It could be bacterial diarrhea. You may need to provide a stool culture. CLEAR FLUIDS: * Drink more fluids. * Sip water or a half-strength sports drink (Gatorade or Powerade; mix half and half with water) * Other options: oral rehydration solution (Pedialyte or Rehydralyte) . CALL BACK IF: * Signs of dehydration occur (e.g., no urine over 12 hours, very dry mouth, lightheaded, etc.) * Bloody stools * Constant or severe abdominal pain * You become worse. CARE ADVICE given per Diarrhea (Adult) guideline. Referrals REFERRED TO PCP OFFICE

## 2019-08-02 NOTE — Telephone Encounter (Signed)
Spoke with patient.  He was seen by [facility] today and obtained a COVID test.  Is at home under quarantine waiting for results.   Has nausea/vomitting/diarrhea but fever broke yesterday pm and he is starting to feel better.  No breathing difficulty reported.   He is aware of quarantine guidelines and that we cannot access urgent cares results or records.  I have asked him to please keep [place] updated as needed and if he needs anything in the meantime.   Urgent care reviewed quarantine, fluid push and rest instructions.   He thanks [place] for the call.

## 2019-08-03 NOTE — Telephone Encounter (Signed)
Noted. Thanks.

## 2019-11-05 ENCOUNTER — Other Ambulatory Visit: Payer: Self-pay | Admitting: Family Medicine

## 2019-11-05 DIAGNOSIS — F5105 Insomnia due to other mental disorder: Secondary | ICD-10-CM | POA: Diagnosis not present

## 2019-11-05 DIAGNOSIS — F4011 Social phobia, generalized: Secondary | ICD-10-CM | POA: Diagnosis not present

## 2019-11-05 DIAGNOSIS — F4312 Post-traumatic stress disorder, chronic: Secondary | ICD-10-CM | POA: Diagnosis not present

## 2019-11-05 DIAGNOSIS — F39 Unspecified mood [affective] disorder: Secondary | ICD-10-CM | POA: Diagnosis not present

## 2019-11-05 NOTE — Telephone Encounter (Signed)
Electronic refill request. Aripiprazole Last office visit:   06/08/2019 Last Filled:    90 tablet 0 07/20/2019  Please advise.

## 2019-11-07 ENCOUNTER — Telehealth: Payer: Self-pay | Admitting: Family Medicine

## 2019-11-07 DIAGNOSIS — R4586 Emotional lability: Secondary | ICD-10-CM

## 2019-11-07 NOTE — Telephone Encounter (Signed)
See below. Referral placed. Please contact patient.  Thanks.

## 2019-11-07 NOTE — Telephone Encounter (Signed)
-----   Message from Chauncey Mann, MD sent at 11/05/2019 10:54 AM EST ----- Thank you for the referral. I saw him today. Is there anyway you can put an internal referral in for him to see Debbe Bales, therapist at [facility]. I think he might be a good fit

## 2019-11-07 NOTE — Telephone Encounter (Signed)
Sent. Thanks.  Please get update on patient.  Was he able to see psychiatry?  How is he doing?

## 2019-11-08 NOTE — Telephone Encounter (Signed)
Noted. Thanks.  Glad to hear.  Will await the psych notes.

## 2019-11-08 NOTE — Telephone Encounter (Signed)
Patient had an appointment with Psychiatry this past Friday and it went well.  He has another appointment scheduled in January.  Patient says he is doing fine.

## 2019-11-26 DIAGNOSIS — F5105 Insomnia due to other mental disorder: Secondary | ICD-10-CM | POA: Diagnosis not present

## 2019-11-26 DIAGNOSIS — F4011 Social phobia, generalized: Secondary | ICD-10-CM | POA: Diagnosis not present

## 2019-11-26 DIAGNOSIS — F39 Unspecified mood [affective] disorder: Secondary | ICD-10-CM | POA: Diagnosis not present

## 2019-11-26 DIAGNOSIS — F4312 Post-traumatic stress disorder, chronic: Secondary | ICD-10-CM | POA: Diagnosis not present

## 2019-12-17 DIAGNOSIS — F4011 Social phobia, generalized: Secondary | ICD-10-CM | POA: Diagnosis not present

## 2019-12-17 DIAGNOSIS — F5105 Insomnia due to other mental disorder: Secondary | ICD-10-CM | POA: Diagnosis not present

## 2019-12-17 DIAGNOSIS — F39 Unspecified mood [affective] disorder: Secondary | ICD-10-CM | POA: Diagnosis not present

## 2019-12-17 DIAGNOSIS — F4312 Post-traumatic stress disorder, chronic: Secondary | ICD-10-CM | POA: Diagnosis not present

## 2019-12-31 ENCOUNTER — Ambulatory Visit: Payer: BC Managed Care – PPO | Admitting: Psychology

## 2020-01-07 DIAGNOSIS — F4312 Post-traumatic stress disorder, chronic: Secondary | ICD-10-CM | POA: Diagnosis not present

## 2020-01-07 DIAGNOSIS — F5105 Insomnia due to other mental disorder: Secondary | ICD-10-CM | POA: Diagnosis not present

## 2020-01-07 DIAGNOSIS — F39 Unspecified mood [affective] disorder: Secondary | ICD-10-CM | POA: Diagnosis not present

## 2020-01-07 DIAGNOSIS — F4011 Social phobia, generalized: Secondary | ICD-10-CM | POA: Diagnosis not present

## 2020-01-14 ENCOUNTER — Ambulatory Visit (INDEPENDENT_AMBULATORY_CARE_PROVIDER_SITE_OTHER): Payer: BC Managed Care – PPO | Admitting: Psychology

## 2020-01-14 DIAGNOSIS — F411 Generalized anxiety disorder: Secondary | ICD-10-CM | POA: Diagnosis not present

## 2020-01-21 DIAGNOSIS — F39 Unspecified mood [affective] disorder: Secondary | ICD-10-CM | POA: Diagnosis not present

## 2020-01-21 DIAGNOSIS — F411 Generalized anxiety disorder: Secondary | ICD-10-CM | POA: Diagnosis not present

## 2020-01-28 ENCOUNTER — Ambulatory Visit: Payer: BC Managed Care – PPO | Admitting: Psychology

## 2020-02-04 DIAGNOSIS — F39 Unspecified mood [affective] disorder: Secondary | ICD-10-CM | POA: Diagnosis not present

## 2020-02-04 DIAGNOSIS — F4312 Post-traumatic stress disorder, chronic: Secondary | ICD-10-CM | POA: Diagnosis not present

## 2020-02-04 DIAGNOSIS — F4011 Social phobia, generalized: Secondary | ICD-10-CM | POA: Diagnosis not present

## 2020-02-04 DIAGNOSIS — F5105 Insomnia due to other mental disorder: Secondary | ICD-10-CM | POA: Diagnosis not present

## 2020-02-11 ENCOUNTER — Ambulatory Visit: Payer: BC Managed Care – PPO | Admitting: Psychology

## 2020-04-21 ENCOUNTER — Ambulatory Visit: Payer: BC Managed Care – PPO | Admitting: Family Medicine

## 2020-04-21 ENCOUNTER — Ambulatory Visit (INDEPENDENT_AMBULATORY_CARE_PROVIDER_SITE_OTHER)
Admission: RE | Admit: 2020-04-21 | Discharge: 2020-04-21 | Disposition: A | Discharge status: Home / Self Care | Payer: BC Managed Care – PPO | Source: Ambulatory Visit | Attending: Family Medicine | Admitting: Family Medicine

## 2020-04-21 ENCOUNTER — Encounter: Payer: Self-pay | Admitting: Family Medicine

## 2020-04-21 ENCOUNTER — Other Ambulatory Visit: Payer: Self-pay

## 2020-04-21 VITALS — BP 116/70 | HR 81 | Temp 98.2°F | Ht 70.0 in | Wt 244.5 lb

## 2020-04-21 DIAGNOSIS — M5412 Radiculopathy, cervical region: Secondary | ICD-10-CM | POA: Diagnosis not present

## 2020-04-21 MED ORDER — PREDNISONE 20 MG PO TABS
ORAL_TABLET | ORAL | 0 refills | Status: DC
Start: 2020-04-21 — End: 2020-05-12

## 2020-04-21 MED ORDER — GABAPENTIN 100 MG PO CAPS: 100.0000 mg | ORAL_CAPSULE | Freq: Three times a day (TID) | ORAL | 0 refills | Status: DC

## 2020-04-21 MED ORDER — DEXAMETHASONE SODIUM PHOSPHATE 10 MG/ML IJ SOLN
10.0000 mg | Freq: Once | INTRAMUSCULAR | Status: AC
  Administered 2020-04-21: 10 mg via INTRAMUSCULAR

## 2020-04-21 MED ORDER — HYDROCODONE-ACETAMINOPHEN 5-325 MG PO TABS: 0.5000 | ORAL_TABLET | Freq: Three times a day (TID) | ORAL | 0 refills | Status: DC | PRN

## 2020-04-21 NOTE — Progress Notes (Signed)
This visit was conducted in person.  BP 116/70 (BP Location: Left Arm, Patient Position: Sitting, Cuff Size: Large)   Pulse 81   Temp 98.2 F (36.8 C) (Temporal)   Ht 5\' 10"  (1.778 m)   Wt 244 lb 8 oz (110.9 kg)   SpO2 96%   BMI 35.08 kg/m    CC: R neck pain Subjective:    Patient ID: Jesse Melton, male    DOB: 05-Mar-1981, 39 y.o.   MRN: [id number]  HPI: Jesse Melton is a 39 y.o. male presenting on 04/21/2020 for Neck Pain (C/o right side neck pain.  Occasionally radiates down right arm into thumb.  Started about 3 wks ago.  Noticed after resting weight bar on neck while lifting during exercise.  Tried ibuprofen, helpful.  Also, lifting right arm forward with bent elbow helps relieve pain. )   R-handed.  3-4 wk h/o R neck pain associated with R thumb numbness. Monday did swats at gym, placed weight bar on shoulder, acutely worsened pain now shooting down neck and shoulder into lateral upper arm. Last night severe pain to R neck with shooting pain down arm. Thumb staying numb, paresthesias. Notes R bicep>forearm weakness. Some numbness at R lateral elbow as well.   Raising arm above head provides some relief.  Ibuprofen 800mg  QID helps temporarily. Tylenol and aspirin didn't help much.      Relevant past medical, surgical, family and social history reviewed and updated as indicated. Interim medical history since our last visit reviewed. Allergies and medications reviewed and updated. Outpatient Medications Prior to Visit  Medication Sig Dispense Refill  . ARIPiprazole (ABILIFY) 5 MG tablet TAKE 1 TABLET BY MOUTH EVERY DAY 90 tablet 0  . buPROPion (WELLBUTRIN) 100 MG tablet TAKE 1 TABLET (100 MG TOTAL) BY MOUTH 3 (THREE) TIMES DAILY. 270 tablet 1  . venlafaxine XR (EFFEXOR-XR) 75 MG 24 hr capsule Take 75 mg by mouth every morning.     No facility-administered medications prior to visit.     Per HPI unless specifically indicated in ROS section below Review of Systems Objective:  BP  116/70 (BP Location: Left Arm, Patient Position: Sitting, Cuff Size: Large)   Pulse 81   Temp 98.2 F (36.8 C) (Temporal)   Ht 5\' 10"  (1.778 m)   Wt 244 lb 8 oz (110.9 kg)   SpO2 96%   BMI 35.08 kg/m   Wt Readings from Last 3 Encounters:  04/21/20 244 lb 8 oz (110.9 kg)  06/08/19 227 lb 6 oz (103.1 kg)  12/31/17 243 lb (110.2 kg)      Physical Exam Vitals and nursing note reviewed.  Constitutional:      Appearance: Normal appearance. He is obese. He is not ill-appearing.  Neck:     Comments: Keeps neck laterally flexed and rotated to the right Musculoskeletal:        General: Tenderness present. No swelling. Normal range of motion.     Cervical back: Neck supple.     Comments:  Mild discomfort to palpation midline cervical spine at C5/6, R trap discomfort   Lymphadenopathy:     Cervical: No cervical adenopathy.  Skin:    General: Skin is warm and dry.     Capillary Refill: Capillary refill takes less than 2 seconds.     Findings: No rash.  Neurological:     General: No focal deficit present.     Mental Status: He is alert.     Sensory: Sensory deficit present.  Motor: Motor function is intact. No weakness.     Comments:  + spurling test R>L 5/5 strength BUE with intact grip strength as well as intrinsic hand muscles Diminished reflexes BUE Diminished sensation to light touch and temperature at R thumb and mid forearm        Assessment & Plan:  This visit occurred during the SARS-CoV-2 public health emergency.  Safety protocols were in place, including screening questions prior to the visit, additional usage of staff PPE, and extensive cleaning of exam room while observing appropriate contact time as indicated for disinfecting solutions.  [place] CSRS reviewed.  Problem List Items Addressed This Visit    Right cervical radiculopathy - Primary    Story/exam consistent with presumed compressive R cervical radiculopathy with radicular pain along C5/6 distribution. Reviewed  dx this with patient. Treat with dexamethasone 10mg  IM today then start prednisone taper, tapering dose of gabapentin as tolerated, and vicodin for breakthrough pain. Reviewed possible adverse effects and side effects of medications prescribed.  Cervical films today.  Discussed anticipated course of recovery including need to avoid resistance training until symptoms improving. Discussed possible need for PT vs MRI pending recovery.  RTC 3 wks f/u with me or PCP. He will keep updated via mychart otherwise.       Relevant Medications   venlafaxine XR (EFFEXOR-XR) 75 MG 24 hr capsule   gabapentin (NEURONTIN) 100 MG capsule   Other Relevant Orders   DG Cervical Spine Complete       Meds ordered this encounter  Medications  . dexamethasone (DECADRON) injection 10 mg  . predniSONE (DELTASONE) 20 MG tablet    Sig: Take 3 tablets daily for 3 days followed by 2 tablets for 3 days followed by 1 tablet daily for 3 days    Dispense:  18 tablet    Refill:  0  . HYDROcodone-acetaminophen (NORCO/VICODIN) 5-325 MG tablet    Sig: Take 0.5-1 tablets by mouth 3 (three) times daily as needed for moderate pain.    Dispense:  15 tablet    Refill:  0  . gabapentin (NEURONTIN) 100 MG capsule    Sig: Take 1-3 capsules (100-300 mg total) by mouth 3 (three) times daily.    Dispense:  90 capsule    Refill:  0   Orders Placed This Encounter  Procedures  . DG Cervical Spine Complete    Standing Status:   Future    Number of Occurrences:   1    Standing Expiration Date:   04/21/2021    Order Specific Question:   Reason for Exam (SYMPTOM  OR DIAGNOSIS REQUIRED)    Answer:   R cervical radicular pain with R C5/6 radiculopathy x 4 wks    Order Specific Question:   Preferred imaging location?    Answer:   06/21/2021    Order Specific Question:   Radiology Contrast Protocol - do NOT remove file path    Answer:   [email or web address]    Patient  instructions: Xray today Steroid shot today I think you have herniated a disc causing pinching of a nerve on the right. Treat with prednisone taper, gabapentin nerve pain medicine, and vicodin for breakthrough pain.  Take gabapentin 300mg  at night time. If tolerating well and ongoing nerve pain after 2 days, may increase to 300mg  twice daily. May increase again to 3 times daily after another 3-4 days if needed.  Start with 1/2 tablet vicodin, don't take and drive as it can make you  sleepy. After the prednisone taper, may restart ibuprofen 800mg  three times daily with meals.  Return in 3 weeks for follow up visit with myself or Dr .  Update Para March with how you're doing. If not improving as expected, we may refer you for PT. If worsening weakness or pain let us know sooner to discuss possible MRI.   Follow up plan: Return if symptoms worsen or fail to improve.  [place], MD

## 2020-04-21 NOTE — Assessment & Plan Note (Addendum)
Story/exam consistent with presumed compressive R cervical radiculopathy with radicular pain along C5/6 distribution. Reviewed dx this with patient. Treat with dexamethasone 10mg  IM today then start prednisone taper, tapering dose of gabapentin as tolerated, and vicodin for breakthrough pain. Reviewed possible adverse effects and side effects of medications prescribed.  Cervical films today.  Discussed anticipated course of recovery including need to avoid resistance training until symptoms improving. Discussed possible need for PT vs MRI pending recovery.  RTC 3 wks f/u with me or PCP. He will keep updated via mychart otherwise.

## 2020-04-21 NOTE — Patient Instructions (Signed)
Xray today Steroid shot today I think you have herniated a disc causing pinching of a nerve on the right. Treat with prednisone taper, gabapentin nerve pain medicine, and vicodin for breakthrough pain.  Take gabapentin 300mg  at night time. If tolerating well and ongoing nerve pain after 2 days, may increase to 300mg  twice daily. May increase again to 3 times daily after another 3-4 days if needed.  Start with 1/2 tablet vicodin, don't take and drive as it can make you sleepy. After the prednisone taper, may restart ibuprofen 800mg  three times daily with meals.  Return in 3 weeks for follow up visit with myself or Dr .  Update with how you're doing. If not improving as expected, we may refer you for PT. If worsening weakness or pain let know sooner to discuss possible MRI.   Cervical Radiculopathy  Cervical radiculopathy happens when a nerve in the neck (a cervical nerve) is pinched or bruised. This condition can happen because of an injury to the cervical spine (vertebrae) in the neck, or as part of the normal aging process. Pressure on the cervical nerves can cause pain or numbness that travels from the neck all the way down into the arm and fingers. Usually, this condition gets better with rest. Treatment may be needed if the condition does not improve. What are the causes? This condition may be caused by:  A neck injury.  A bulging (herniated) disk.  Muscle spasms.  Muscle tightness in the neck because of overuse.  Arthritis.  Breakdown or degeneration in the bones and joints of the spine (spondylosis) due to aging.  Bone spurs that may develop near the cervical nerves. What are the signs or symptoms? Symptoms of this condition include:  Pain. The pain may travel from the neck to the arm and hand. The pain can be severe or irritating. It may be worse when you move your neck.  Numbness or tingling in your arm or hand.  Weakness in the affected arm and hand, in severe  cases. How is this diagnosed? This condition may be diagnosed based on your symptoms, your medical history, and a physical exam. You may also have tests, including:  X-rays.  A CT scan.  An MRI.  An electromyogram (EMG).  Nerve conduction tests. How is this treated? In many cases, treatment is not needed for this condition. With rest, the condition usually gets better over time. If treatment is needed, options may include:  Wearing a soft neck collar (cervical collar) for short periods of time, as told by your health care provider.  Doing physical therapy to strengthen your neck muscles.  Taking medicines, such as NSAIDs or oral corticosteroids.  Having spinal injections, in severe cases.  Having surgery. This may be needed if other treatments do not help. Different types of surgery may be done depending on the cause of this condition. Follow these instructions at home: If you have a cervical collar:  Wear it as told by your health care provider. Remove it only as told by your health care provider.  Ask your health care provider if you can remove the collar for cleaning and bathing. If you are allowed to remove the collar for cleaning or bathing: ? Follow instructions from your health care provider about how to remove the collar safely. ? Clean the collar by wiping it with mild soap and water and drying it completely. ? Take out any removable pads in the collar every 1-2 days, and wash them  by hand with soap and water. Let them air-dry completely before you put them back in the collar. ? Check your skin under the collar for irritation or sores. If you see any, tell your health care provider. Managing pain      Take over-the-counter and prescription medicines only as told by your health care provider.  If directed, put ice on the affected area. ? If you have a soft neck collar, remove it as told by your health care provider. ? Put ice in a plastic bag. ? Place a towel  between your skin and the bag. ? Leave the ice on for 20 minutes, 2-3 times a day.  If applying ice does not help, you can try using heat. Use the heat source that your health care provider recommends, such as a moist heat pack or a heating pad. ? Place a towel between your skin and the heat source. ? Leave the heat on for 20-30 minutes. ? Remove the heat if your skin turns bright red. This is especially important if you are unable to feel pain, heat, or cold. You may have a greater risk of getting burned.  Try a gentle neck and shoulder massage to help relieve symptoms. Activity  Rest as needed.  Return to your normal activities as told by your health care provider. Ask your health care provider what activities are safe for you.  Do stretching and strengthening exercises as told by your health care provider or physical therapist.  Do not lift anything that is heavier than 10 lb (4.5 kg) until your health care provider tells you that it is safe. General instructions  Use a flat pillow when you sleep.  Do not drive while wearing a cervical collar. If you do not have a cervical collar, ask your health care provider if it is safe to drive while your neck heals.  Ask your health care provider if the medicine prescribed to you requires you to avoid driving or using heavy machinery.  Do not use any products that contain nicotine or tobacco, such as cigarettes, e-cigarettes, and chewing tobacco. These can delay healing. If you need help quitting, ask your health care provider.  Keep all follow-up visits as told by your health care provider. This is important. Contact a health care provider if:  Your condition does not improve with treatment. Get help right away if:  Your pain gets much worse and cannot be controlled with medicines.  You have weakness or numbness in your hand, arm, face, or leg.  You have a high fever.  You have a stiff, rigid neck.  You lose control of your bowels  or your bladder (have incontinence).  You have trouble with walking, balance, or speaking. Summary  Cervical radiculopathy happens when a nerve in the neck is pinched or bruised.  A nerve can get pinched from a bulging disk, arthritis, muscle spasms, or an injury to the neck.  Symptoms include pain, tingling, or numbness radiating from the neck into the arm or hand. Weakness can also occur in severe cases.  Treatment may include rest, wearing a cervical collar, and physical therapy. Medicines may be prescribed to help with pain. In severe cases, injections or surgery may be needed. This information is not intended to replace advice given to you by your health care provider. Make sure you discuss any questions you have with your health care provider. Document Revised: 09/25/2018 Document Reviewed: 09/25/2018 Elsevier Patient Education  2020 [employer].

## 2020-04-24 ENCOUNTER — Ambulatory Visit: Payer: BC Managed Care – PPO | Admitting: Family Medicine

## 2020-05-12 ENCOUNTER — Encounter: Payer: Self-pay | Admitting: Family Medicine

## 2020-05-12 ENCOUNTER — Ambulatory Visit: Payer: BC Managed Care – PPO | Admitting: Family Medicine

## 2020-05-12 ENCOUNTER — Other Ambulatory Visit: Payer: Self-pay

## 2020-05-12 VITALS — BP 124/74 | HR 92 | Temp 97.2°F | Ht 69.0 in | Wt 244.2 lb

## 2020-05-12 DIAGNOSIS — F339 Major depressive disorder, recurrent, unspecified: Secondary | ICD-10-CM

## 2020-05-12 DIAGNOSIS — M5412 Radiculopathy, cervical region: Secondary | ICD-10-CM | POA: Diagnosis not present

## 2020-05-12 MED ORDER — GABAPENTIN 100 MG PO CAPS: 300.0000 mg | ORAL_CAPSULE | Freq: Every evening | ORAL | Status: DC | PRN

## 2020-05-12 NOTE — Progress Notes (Signed)
This visit occurred during the SARS-CoV-2 public health emergency.  Safety protocols were in place, including screening questions prior to the visit, additional usage of staff PPE, and extensive cleaning of exam room while observing appropriate contact time as indicated for disinfecting solutions.  Venlafaxine didn't help much.  He was drowsy with that and it didn't seem to help.  He seemed to be be getting relief with ability and wellbutrin.  He had seen Dr. Ledell Noss.  He can tolerate anxiety as is.  No SI/HI.    We talked job stressors and pandemic stressors.  He was furloughed then on overtime in the meantime.    Neck pain d/w pt.  He had some R thumb numbness but then had more pain after doing squats at the gym.  He had more neck pain.  Gabapentin helped after a few days.  S/p prednisone course, he was able to tolerate prednisone but had inc in appetite.  Off hydrocodone and gabapentin now.  Now with occ pain, intermittently, with computer work with R arm abducted.  He isn't doing squats now.    Prev C spine films with IMPRESSION: 1. Unremarkable cervical spine.  We talked about options.  Either PT vs gabapentin use vs imaging.  He can still have some intermittent tingling down the R arm. He still has some weakness in the R arm.    He can feel a twinge looking back and to the R.  occ R sided posterior neck pain.    Meds, vitals, and allergies reviewed.   ROS: Per HPI unless specifically indicated in ROS section   nad ncat Neck with normal range of motion.  No lymphadenopathy. rrr ctab Altered but not absent sensation in the R forearm.  DTR equal BUE.  Minimal weakness in R biceps but equal BUE wnl o/w.  S/S grossly wnl BLE

## 2020-05-12 NOTE — Patient Instructions (Addendum)
Stop the venlafaxine and update me as needed.   Use gabapentin as needed and we'll call about seeing PT.  If not better with that, then we can either get an MRI and/or send you to the neurosurgery clinic.  Take care.  Glad to see you.

## 2020-05-14 NOTE — Assessment & Plan Note (Signed)
He will stop venlafaxine and update me as needed.  Okay for outpatient follow-up.

## 2020-05-14 NOTE — Assessment & Plan Note (Addendum)
Discussed options.  Very minimal weakness in the right biceps but no weakness noted otherwise in the upper or lower extremities.  He can use gabapentin as needed and we'll call about seeing PT.  If not better with that, then we can either get an MRI and/or send him to the neurosurgery clinic.  He agrees.  He will update me as needed.

## 2020-06-02 ENCOUNTER — Telehealth: Payer: Self-pay | Admitting: Family Medicine

## 2020-06-02 NOTE — Telephone Encounter (Signed)
Medication Refill - Medication:  ARIPiprazole (ABILIFY) 5 MG tablet buPROPion (WELLBUTRIN) 100 MG tablet  Has the patient contacted their pharmacy? Yes, advised to call. Patient states his physiatrist and him did not work out and he is needing PCP to refill medications until he finds another. Please advise.    Preferred Pharmacy (with phone number or street name):  [id number] - [place], [place] - [address] Phone:  [phone]  Fax:  [phone]

## 2020-06-04 MED ORDER — BUPROPION HCL 100 MG PO TABS: 100.0000 mg | ORAL_TABLET | Freq: Three times a day (TID) | ORAL | 1 refills | Status: DC

## 2020-06-04 MED ORDER — ARIPIPRAZOLE 5 MG PO TABS: 5.0000 mg | ORAL_TABLET | Freq: Every day | ORAL | 1 refills | Status: DC

## 2020-06-04 NOTE — Telephone Encounter (Signed)
Sent.  Let me know if he needs another referral to psychiatry.  Please let me know how he is doing in terms of his neck/arm symptoms.  Thanks.

## 2020-06-05 NOTE — Telephone Encounter (Signed)
Left message on patient's voicemail to return call with information. 

## 2020-06-09 DIAGNOSIS — M542 Cervicalgia: Secondary | ICD-10-CM | POA: Diagnosis not present

## 2020-06-23 DIAGNOSIS — M542 Cervicalgia: Secondary | ICD-10-CM | POA: Diagnosis not present

## 2020-06-30 DIAGNOSIS — M542 Cervicalgia: Secondary | ICD-10-CM | POA: Diagnosis not present

## 2020-07-07 DIAGNOSIS — M542 Cervicalgia: Secondary | ICD-10-CM | POA: Diagnosis not present

## 2020-07-14 DIAGNOSIS — M542 Cervicalgia: Secondary | ICD-10-CM | POA: Diagnosis not present

## 2020-07-21 DIAGNOSIS — M542 Cervicalgia: Secondary | ICD-10-CM | POA: Diagnosis not present

## 2021-03-04 ENCOUNTER — Other Ambulatory Visit: Payer: Self-pay | Admitting: Family Medicine

## 2021-03-05 NOTE — Telephone Encounter (Signed)
Pharmacy requests refill on: Bupropion 100 mg   LAST REFILL: 06/04/2020 (Q-270, R-1) LAST OV: 05/12/2020 NEXT OV: Not Scheduled  PHARMACY: [id number] [place], [place]   Pharmacy requests refill on: Aripiprazole 5 mg   LAST REFILL: 06/04/2020 (Q-90, R-1) LAST OV: 05/12/2020  NEXT OV: Not Scheduled  PHARMACY: [id number] [place], [place]

## 2021-09-12 ENCOUNTER — Other Ambulatory Visit: Payer: Self-pay | Admitting: Family

## 2021-12-21 ENCOUNTER — Encounter: Payer: Self-pay | Admitting: Family Medicine

## 2021-12-21 ENCOUNTER — Ambulatory Visit: Payer: BC Managed Care – PPO | Admitting: Family Medicine

## 2021-12-21 ENCOUNTER — Other Ambulatory Visit: Payer: Self-pay

## 2021-12-21 VITALS — BP 138/100 | HR 103 | Temp 97.3°F | Ht 69.0 in | Wt 275.0 lb

## 2021-12-21 DIAGNOSIS — M549 Dorsalgia, unspecified: Secondary | ICD-10-CM

## 2021-12-21 DIAGNOSIS — F339 Major depressive disorder, recurrent, unspecified: Secondary | ICD-10-CM | POA: Diagnosis not present

## 2021-12-21 MED ORDER — ARIPIPRAZOLE 5 MG PO TABS: 5.0000 mg | ORAL_TABLET | Freq: Every day | ORAL | 1 refills | Status: DC

## 2021-12-21 MED ORDER — BUPROPION HCL 100 MG PO TABS: 100.0000 mg | ORAL_TABLET | Freq: Three times a day (TID) | ORAL | 1 refills | Status: DC

## 2021-12-21 MED ORDER — PREDNISONE 20 MG PO TABS: ORAL_TABLET | ORAL | 0 refills | Status: DC

## 2021-12-21 MED ORDER — GABAPENTIN 100 MG PO CAPS: 100.0000 mg | ORAL_CAPSULE | Freq: Three times a day (TID) | ORAL | 1 refills | Status: DC | PRN

## 2021-12-21 NOTE — Progress Notes (Signed)
This visit occurred during the SARS-CoV-2 public health emergency.  Safety protocols were in place, including screening questions prior to the visit, additional usage of staff PPE, and extensive cleaning of exam room while observing appropriate contact time as indicated for disinfecting solutions.  Back pain.  No clear trigger but pain started last week.  Was going to the gym at baseline.  Coming into the house carrying groceries and pain started 12/15/21.  Pain near L SI and radiates down the L leg when driving.  No R leg sx.  No FCNAVD.  No urinary or bowel sx.  No numbness.  No trauma last week.  Has been try to stretch and using ibuprofen.  He has pain down the left leg when he is trying to do a straight leg lift at home.  Mood is better with meds and his mood was "workable."  Abilify helped with ups and downs.  Depression is better on meds.  No SI/HI.  Work situation d/w pt. compliant with medications.  Meds, vitals, and allergies reviewed.   ROS: Per HPI unless specifically indicated in ROS section   GEN: nad, alert and oriented HEENT: ncat NECK: supple w/o LA CV: rrr.   PULM: ctab, no inc wob ABD: soft, +bs EXT: no edema SKIN: no acute rash Straight leg raise deferred given concern for pain positive results per patient report at home. Back not tender palpation of the midline but tender near the left SI joint.

## 2021-12-21 NOTE — Patient Instructions (Signed)
Flu shot when possible.  Schedule a fasting lab visit when possible.   Take prednisone 2 a day for 5 days, then 1 a day for 5 days, with food. Don't take with aleve/ibuprofen. Start 100mg  gabapentin prior to sleeping.  Gradually increase the dose if needed/tolerated for pain.   Update me as needed.  Take care.  Glad to see you.

## 2021-12-23 DIAGNOSIS — M549 Dorsalgia, unspecified: Secondary | ICD-10-CM | POA: Insufficient documentation

## 2021-12-23 NOTE — Assessment & Plan Note (Signed)
History consistent with sciatica.  Discussed options.  Strength and sensation still intact in lower extremities.  Able to bear weight.  Reasonable to use prednisone and gabapentin with routine steroid and sedation caution on both medications respectively.  Rationale for use discussed with patient.  Okay for outpatient follow-up.  He agrees with plan.  See after visit summary.  He can update me as needed.

## 2021-12-23 NOTE — Assessment & Plan Note (Signed)
Given his current medications and his history will be reasonable to get follow-up labs set when possible.  Discussed with patient.  See after visit summary.

## 2021-12-31 ENCOUNTER — Ambulatory Visit: Payer: BC Managed Care – PPO | Admitting: Family Medicine

## 2021-12-31 ENCOUNTER — Other Ambulatory Visit: Payer: Self-pay

## 2021-12-31 ENCOUNTER — Ambulatory Visit (INDEPENDENT_AMBULATORY_CARE_PROVIDER_SITE_OTHER)
Admission: RE | Admit: 2021-12-31 | Discharge: 2021-12-31 | Disposition: A | Discharge status: Home / Self Care | Payer: BC Managed Care – PPO | Source: Ambulatory Visit | Attending: Family Medicine | Admitting: Family Medicine

## 2021-12-31 ENCOUNTER — Encounter: Payer: Self-pay | Admitting: Family Medicine

## 2021-12-31 VITALS — BP 154/90 | HR 118 | Temp 97.6°F | Ht 69.0 in | Wt 273.0 lb

## 2021-12-31 DIAGNOSIS — M545 Low back pain, unspecified: Secondary | ICD-10-CM

## 2021-12-31 MED ORDER — PREDNISONE 20 MG PO TABS: ORAL_TABLET | ORAL | 0 refills | Status: DC

## 2021-12-31 MED ORDER — TIZANIDINE HCL 4 MG PO TABS: 4.0000 mg | ORAL_TABLET | Freq: Four times a day (QID) | ORAL | 0 refills | Status: DC | PRN

## 2021-12-31 NOTE — Progress Notes (Signed)
This visit occurred during the SARS-CoV-2 public health emergency.  Safety protocols were in place, including screening questions prior to the visit, additional usage of staff PPE, and extensive cleaning of exam room while observing appropriate contact time as indicated for disinfecting solutions.  Back pain.  >2 but less than 3 weeks of sx.  He had a good day last week, no leg pain, he was feeling better, gait was nearly normal. That was after prednisone, he thought that helped. Gabapentin didn't help.  He then woke up with back spasms, esp on R side. No leg pain but radiates up the back.  Heat helps.  No FCNAVD.  No rash.  No traumatic event recently, other than sleeping on R side.  He is a little better over the weekend but still having sig pain.  Taking aleve in the meantime.   No ADE on prednisone other than mild irritability.    Meds, vitals, and allergies reviewed.   ROS: Per HPI unless specifically indicated in ROS section   Nad Ncat Neck supple no LA Rrr Ctab Abd soft, not ttp Back nontender to palpation in the midline but he has tenderness near the right SI joint.  He does not have pain with internal hip rotation on the right but he has pain near the SI joint with external hip rotation.  Able to bear weight.  Distally neurovascular intact

## 2021-12-31 NOTE — Patient Instructions (Signed)
Go to the lab on the way out.   If you have mychart we'll likely use that to update you.    Take 2 a day for 5 days, then 1 a day for 5 days, with food. Don't take with aleve/ibuprofen. Tizanidine for muscle spasms, as needed.  Sedation caution.  Take care.  Glad to see you. Update me as needed.

## 2022-01-05 NOTE — Assessment & Plan Note (Signed)
Discussed options.  He did have some improvement, apparently related to prednisone use.  Still okay for outpatient follow-up.  We talked about checking plain films today.  He could have irritated his right SI joint, based off his pain distribution and exam.  It would be reasonable to check his lower back via plain films to exclude obvious pathology there.  Discussed repeating his steroid taper.  Routine steroid cautions given to patient, he can start prednisone with 2 tabs a day for 5 days, then 1 a day for 5 days, with food.  Advised not to take with aleve/ibuprofen. Tizanidine for muscle spasms, as needed.  Sedation caution.  He can update me as needed.  He agrees with plan.

## 2022-01-29 ENCOUNTER — Telehealth: Payer: Self-pay

## 2022-01-29 NOTE — Telephone Encounter (Signed)
[facility] Night - Client ?Nonclinical Telephone Record  ?AccessNurse? ?Client [name] ?Contact Type Call ?Who Is Calling Patient / Member / Family / Caregiver ?Caller Name [name] ?Caller Phone Number [phone] ?Patient Name Jesse Melton ?Patient DOB January 21, 1981 ?Call Type Message Only Information Provided ?Reason for Call Request to Schedule Office Appointment ?Initial Comment caller needs appointment ?Patient request to speak to RN No ?Disp. Time Disposition Final User ?01/28/2022 5:36:49 PM General Information Provided Yes [place] ?Call Closed By: Winn [place]ock ?Transaction Date/Time: 01/28/2022 5:33:32 PM (ET ?

## 2022-02-08 ENCOUNTER — Ambulatory Visit: Payer: BC Managed Care – PPO | Admitting: Family Medicine

## 2022-02-08 ENCOUNTER — Encounter: Payer: Self-pay | Admitting: Family Medicine

## 2022-02-08 ENCOUNTER — Other Ambulatory Visit: Payer: Self-pay

## 2022-02-08 VITALS — BP 120/80 | HR 94 | Temp 98.0°F | Ht 69.0 in | Wt 272.0 lb

## 2022-02-08 DIAGNOSIS — F339 Major depressive disorder, recurrent, unspecified: Secondary | ICD-10-CM | POA: Diagnosis not present

## 2022-02-08 DIAGNOSIS — N529 Male erectile dysfunction, unspecified: Secondary | ICD-10-CM | POA: Diagnosis not present

## 2022-02-08 LAB — CBC WITH DIFFERENTIAL/PLATELET
Basophils Absolute: 0.1 10*3/uL (ref 0.0–0.1)
Basophils Relative: 0.9 % (ref 0.0–3.0)
Eosinophils Absolute: 0.1 10*3/uL (ref 0.0–0.7)
Eosinophils Relative: 1.1 % (ref 0.0–5.0)
HCT: 43.7 % (ref 39.0–52.0)
Hemoglobin: 14.9 g/dL (ref 13.0–17.0)
Lymphocytes Relative: 35.5 % (ref 12.0–46.0)
Lymphs Abs: 3.2 10*3/uL (ref 0.7–4.0)
MCHC: 34.1 g/dL (ref 30.0–36.0)
MCV: 89.9 fl (ref 78.0–100.0)
Monocytes Absolute: 0.7 10*3/uL (ref 0.1–1.0)
Monocytes Relative: 7.4 % (ref 3.0–12.0)
Neutro Abs: 5 10*3/uL (ref 1.4–7.7)
Neutrophils Relative %: 55.1 % (ref 43.0–77.0)
Platelets: 231 10*3/uL (ref 150.0–400.0)
RBC: 4.86 Mil/uL (ref 4.22–5.81)
RDW: 13.1 % (ref 11.5–15.5)
WBC: 9.1 10*3/uL (ref 4.0–10.5)

## 2022-02-08 LAB — COMPREHENSIVE METABOLIC PANEL
ALT: 31 U/L (ref 0–53)
AST: 23 U/L (ref 0–37)
Albumin: 4.5 g/dL (ref 3.5–5.2)
Alkaline Phosphatase: 56 U/L (ref 39–117)
BUN: 19 mg/dL (ref 6–23)
CO2: 29 mEq/L (ref 19–32)
Calcium: 9.3 mg/dL (ref 8.4–10.5)
Chloride: 105 mEq/L (ref 96–112)
Creatinine, Ser: 1.23 mg/dL (ref 0.40–1.50)
GFR: 74.37 mL/min (ref >60.00)
Glucose, Bld: 105 mg/dL — ABNORMAL HIGH (ref 70–99)
Potassium: 3.6 mEq/L (ref 3.5–5.1)
Sodium: 141 mEq/L (ref 135–145)
Total Bilirubin: 0.3 mg/dL (ref 0.2–1.2)
Total Protein: 6.9 g/dL (ref 6.0–8.3)

## 2022-02-08 MED ORDER — SILDENAFIL CITRATE 20 MG PO TABS: 20.0000 mg | ORAL_TABLET | Freq: Every day | ORAL | 3 refills | Status: DC | PRN

## 2022-02-08 NOTE — Progress Notes (Signed)
This visit occurred during the SARS-CoV-2 public health emergency.  Safety protocols were in place, including screening questions prior to the visit, additional usage of staff PPE, and extensive cleaning of exam room while observing appropriate contact time as indicated for disinfecting solutions. ? ?His back is better.  He is able to stretch daily and that is helping. He'll update me as needed.   ? ?ED d/w pt.  Going on for years.  Predates abilify use.  He had ED with some meds but not wellbutrin.  Sx gradually got worse over the years.  He tried pump, ring.  He hasn't tried viagra/etc.  No NTG use.  No CP, SOB, BLE edema.  Libido is still normal.   ? ?Meds, vitals, and allergies reviewed.  ? ?ROS: Per HPI unless specifically indicated in ROS section  ? ?Nad ?Ncat ?Neck supple, no LA ?Rrr ?Ctab ?Abd soft, not ttp ?Ext well perfused.  ?

## 2022-02-08 NOTE — Patient Instructions (Signed)
Go to the lab on the way out.   If you have mychart we'll likely use that to update you.    ?Take care.  Glad to see you. ?Update me as needed.  ?Try sildenafil 20-100mg  as needed.  Gradually work up on the dose as needed/tolerated.   ?

## 2022-02-10 DIAGNOSIS — N529 Male erectile dysfunction, unspecified: Secondary | ICD-10-CM | POA: Insufficient documentation

## 2022-02-10 NOTE — Assessment & Plan Note (Signed)
Discussed options.  He was already due for follow-up labs anyway so we will check a testosterone level along with that.  If testosterone level is low we can repeat a confirmatory test with an LH level.  Reasonable to use sildenafil in the meantime with routine cautions.  He agrees with plan.  Still okay for outpatient follow-up.  No nitroglycerin use. ?

## 2022-02-12 LAB — TESTOSTERONE: Testosterone: 234.3 ng/dL — ABNORMAL LOW (ref 300.00–890.00)

## 2022-02-12 LAB — TSH: TSH: 3.71 u[IU]/mL (ref 0.35–5.50)

## 2022-02-13 ENCOUNTER — Other Ambulatory Visit: Payer: Self-pay | Admitting: Family Medicine

## 2022-02-13 DIAGNOSIS — R7989 Other specified abnormal findings of blood chemistry: Secondary | ICD-10-CM

## 2022-03-04 ENCOUNTER — Other Ambulatory Visit (INDEPENDENT_AMBULATORY_CARE_PROVIDER_SITE_OTHER): Payer: BC Managed Care – PPO

## 2022-03-04 DIAGNOSIS — R7989 Other specified abnormal findings of blood chemistry: Secondary | ICD-10-CM | POA: Diagnosis not present

## 2022-03-05 LAB — LUTEINIZING HORMONE: LH: 3.76 m[IU]/mL (ref 1.50–9.30)

## 2022-03-05 LAB — TESTOSTERONE: Testosterone: 359.36 ng/dL (ref 300.00–890.00)

## 2022-05-25 DIAGNOSIS — Z6839 Body mass index (BMI) 39.0-39.9, adult: Secondary | ICD-10-CM | POA: Diagnosis not present

## 2022-05-25 DIAGNOSIS — H6502 Acute serous otitis media, left ear: Secondary | ICD-10-CM | POA: Diagnosis not present

## 2022-07-07 ENCOUNTER — Other Ambulatory Visit: Payer: Self-pay | Admitting: Family Medicine

## 2022-07-09 IMAGING — DX DG LUMBAR SPINE COMPLETE 4+V
5 series · 5 of 5 positions shown · non-contrast
Comparison: None.

CLINICAL DATA: Lower back pain for 2 weeks.

EXAM:
LUMBAR SPINE - COMPLETE 4+ VIEW

[lumbar spine ap]
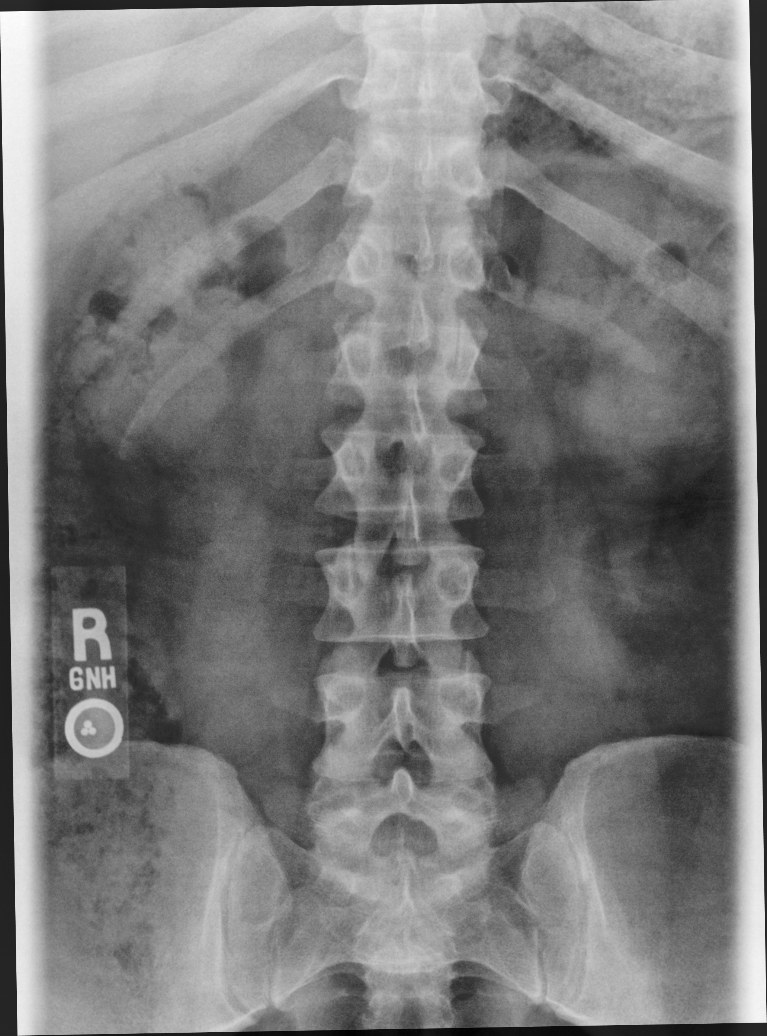

[lumbar spine mlo (1 of 2)]
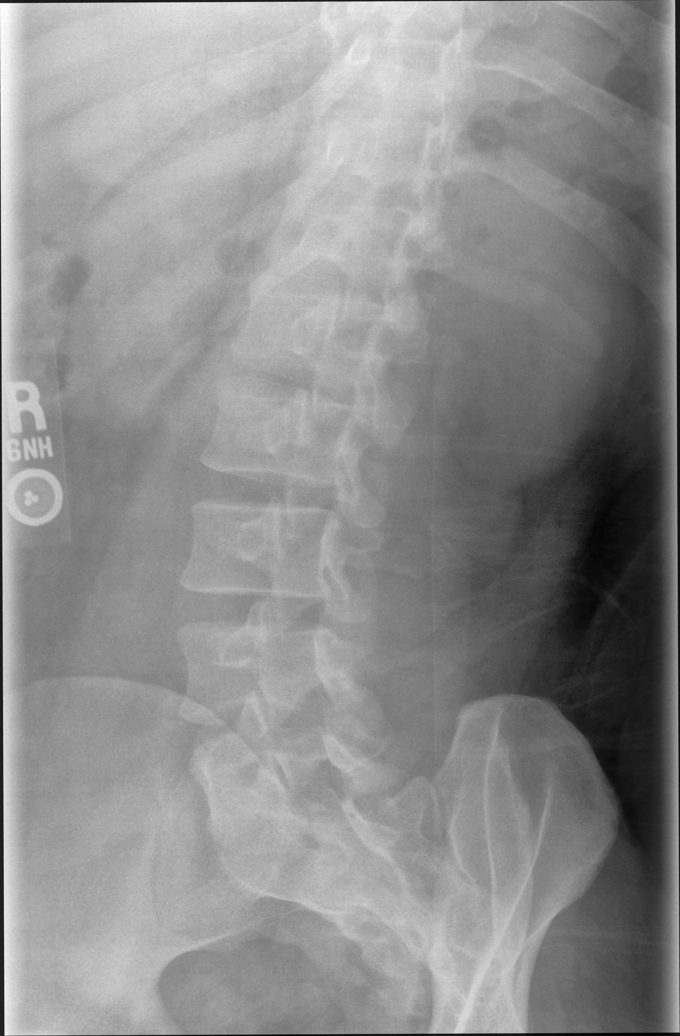

[lumbar spine mlo (2 of 2)]
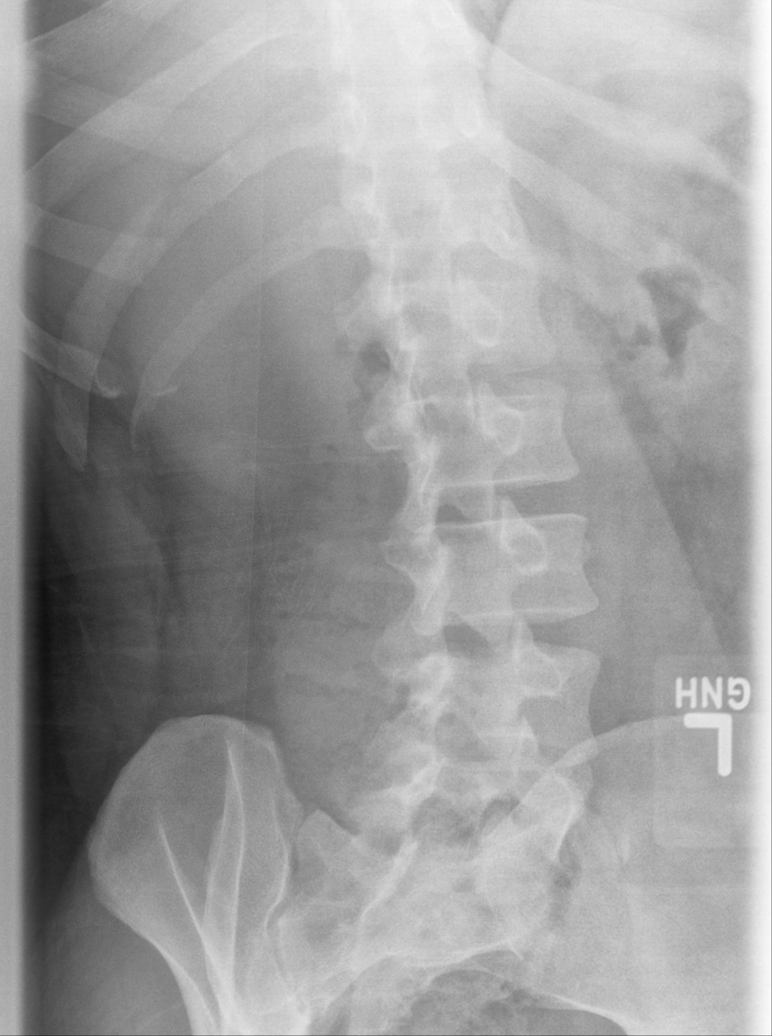

[lumbar spine lat (1 of 2)]
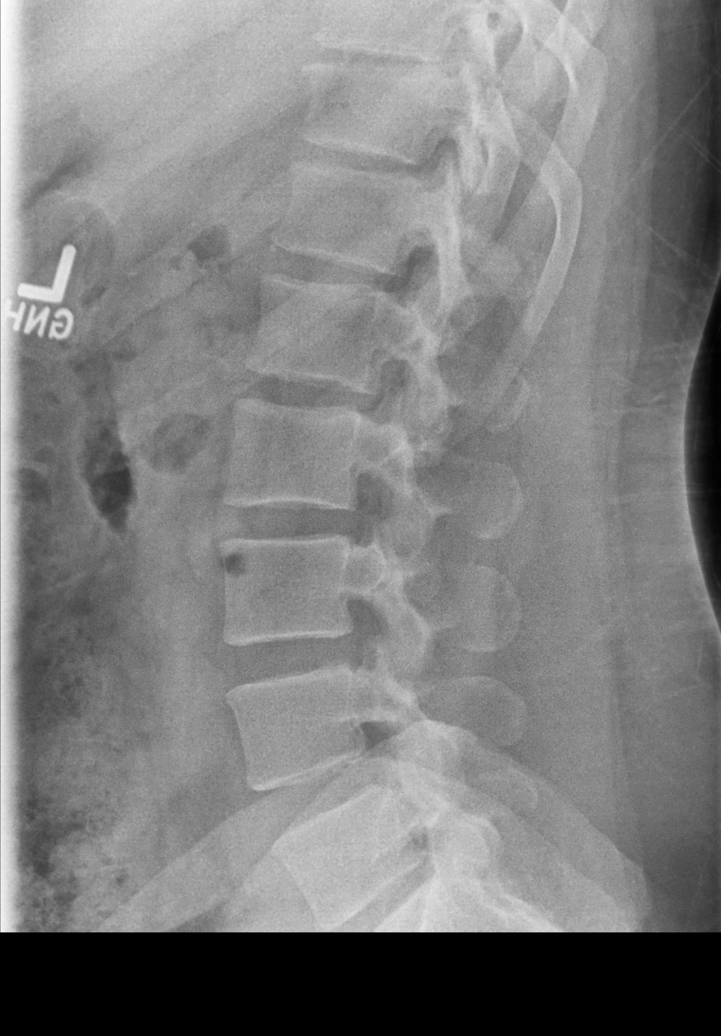

[lumbar spine lat (2 of 2)]
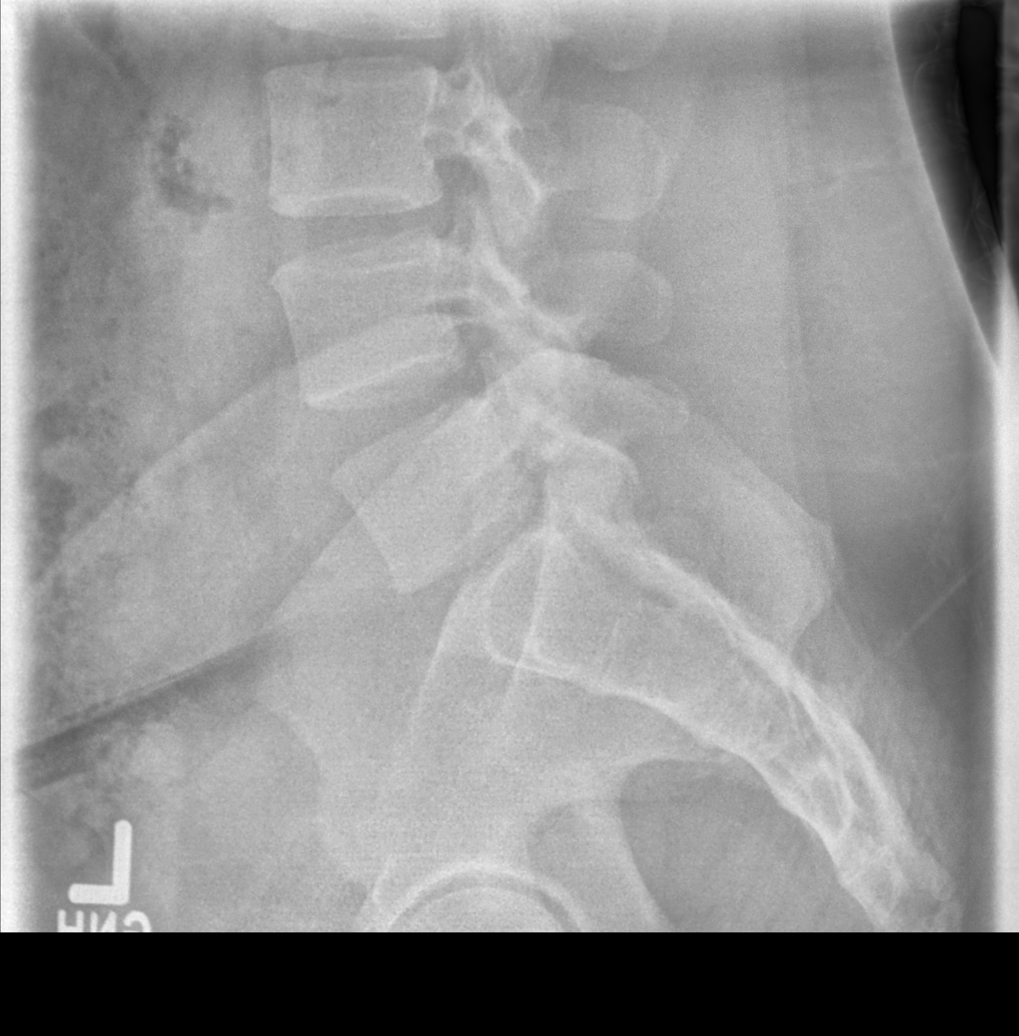

[5 of 5 positions shown; findings below may reference images not displayed]

FINDINGS: Lumbar alignment normal. Mild disc space narrowing at L5-S1.
Vertebral body heights are maintained.
IMPRESSION: Mild disc space narrowing at L5-S1

## 2023-01-14 ENCOUNTER — Ambulatory Visit
Admission: RE | Admit: 2023-01-14 | Discharge: 2023-01-14 | Disposition: A | Discharge status: Home / Self Care | Payer: BC Managed Care – PPO | Source: Ambulatory Visit | Attending: Urgent Care | Admitting: Urgent Care

## 2023-01-14 VITALS — BP 135/91 | HR 97 | Temp 98.3°F | Resp 18

## 2023-01-14 DIAGNOSIS — R6889 Other general symptoms and signs: Secondary | ICD-10-CM

## 2023-01-14 DIAGNOSIS — Z1152 Encounter for screening for COVID-19: Secondary | ICD-10-CM | POA: Insufficient documentation

## 2023-01-14 LAB — POCT RAPID STREP A (OFFICE): Rapid Strep A Screen: NEGATIVE

## 2023-01-14 MED ORDER — OSELTAMIVIR PHOSPHATE 75 MG PO CAPS: 75.0000 mg | ORAL_CAPSULE | Freq: Two times a day (BID) | ORAL | 0 refills | Status: DC

## 2023-01-14 NOTE — ED Provider Notes (Signed)
Jesse Melton    CSN: [id number] Arrival date & time: 01/14/23  1411      History   Chief Complaint Chief Complaint  Patient presents with   Sore Throat    Also have some coughing and fever. Just want to make sure it's not strep or COVID. - Entered by patient    HPI Jesse Melton is a 42 y.o. male.    Sore Throat    Triage by provider.  Presents urgent care with complaint of symptoms starting yesterday.  Patient states that, while at work, started to feel chills.  States he was shaking uncontrollably.  Believes he had a fever but did not measure temperature.  Additional symptoms of a little cough as well as severe sore throat.  Concern for possibility of strep infection or COVID.  Past Medical History:  Diagnosis Date   Allergy    Depression    much improved ~2012 with wellbutrin    Patient Active Problem List   Diagnosis Date Noted   Erectile dysfunction 02/10/2022   Back pain 12/23/2021   Right cervical radiculopathy 04/21/2020   Dysuria 12/31/2017   MDD (major depressive disorder) 11/13/2011    Past Surgical History:  Procedure Laterality Date   VASECTOMY         Home Medications    Prior to Admission medications   Medication Sig Start Date End Date Taking? Authorizing Provider  ARIPiprazole (ABILIFY) 5 MG tablet TAKE 1 TABLET (5 MG TOTAL) BY MOUTH DAILY. 07/08/22   Tonia Ghent, MD  buPROPion (WELLBUTRIN) 100 MG tablet Take 1 tablet (100 mg total) by mouth 3 (three) times daily. 12/21/21   Tonia Ghent, MD  sildenafil (REVATIO) 20 MG tablet Take 1-5 tablets (20-100 mg total) by mouth daily as needed. 02/08/22   Tonia Ghent, MD    Family History Family History  Problem Relation Age of Onset   Heart disease Mother    Mental illness Mother    Diabetes Mother    Stroke Father    Colon cancer Neg Hx    Prostate cancer Neg Hx     Social History Social History   Tobacco Use   Smoking status: Former    Types: Cigars, Cigarettes    Smokeless tobacco: Former    Types: Snuff  Substance Use Topics   Alcohol use: Yes    Comment: Occasional   Drug use: No     Allergies   Seroquel [quetiapine fumarate]   Review of Systems Review of Systems   Physical Exam Triage Vital Signs ED Triage Vitals [01/14/23 1421]  Enc Vitals Group     BP (!) 135/91     Pulse Rate 97     Resp 18     Temp 98.3 F (36.8 C)     Temp Source Oral     SpO2 97 %     Weight      Height      Head Circumference      Peak Flow      Pain Score      Pain Loc      Pain Edu?      Excl. in [place]?    No data found.  Updated Vital Signs BP (!) 135/91 (BP Location: Left Arm)   Pulse 97   Temp 98.3 F (36.8 C) (Oral)   Resp 18   SpO2 97%   Visual Acuity Right Eye Distance:   Left Eye Distance:   Bilateral Distance:  Right Eye Near:   Left Eye Near:    Bilateral Near:     Physical Exam Vitals reviewed.  Constitutional:      Appearance: He is well-developed. He is ill-appearing.  HENT:     Mouth/Throat:     Mouth: Mucous membranes are moist.     Pharynx: Posterior oropharyngeal erythema present. No oropharyngeal exudate.  Cardiovascular:     Rate and Rhythm: Normal rate and regular rhythm.     Heart sounds: Normal heart sounds.  Pulmonary:     Effort: Pulmonary effort is normal.     Breath sounds: Normal breath sounds.  Skin:    General: Skin is warm and dry.  Neurological:     General: No focal deficit present.     Mental Status: He is alert and oriented to person, place, and time.  Psychiatric:        Mood and Affect: Mood normal.        Behavior: Behavior normal.      UC Treatments / Results  Labs (all labs ordered are listed, but only abnormal results are displayed) Labs Reviewed - No data to display  EKG   Radiology No results found.  Procedures Procedures (including critical care time)  Medications Ordered in UC Medications - No data to display  Initial Impression / Assessment and Plan / UC  Course  I have reviewed the triage vital signs and the nursing notes.  Pertinent labs & imaging results that were available during my care of the patient were reviewed by me and considered in my medical decision making (see chart for details).   Patient is afebrile here without recent antipyretics. Satting well on room air. Overall is well appearing, well hydrated, without respiratory distress. Pulmonary exam is unremarkable.  Lungs CTAB without wheezing, rhonchi, rales.  Mild pharyngeal erythema but without peritonsillar exudates.  Rapid strep is negative.  Patient's symptoms are consistent with an acute viral process including influenza and COVID.  Given symptoms are within the treatment window for antiviral therapy for flu, will prescribe Tamiflu whilst awaiting results of COVID swab.  Final Clinical Impressions(s) / UC Diagnoses   Final diagnoses:  None   Discharge Instructions   None    ED Prescriptions   None    PDMP not reviewed this encounter.   Rose Phi, [place] 01/14/23 1445

## 2023-01-14 NOTE — ED Notes (Signed)
Provider triaged.

## 2023-01-14 NOTE — Discharge Instructions (Signed)
Use OTC medications for symptoms control. Follow up here or with your primary care provider if your symptoms are worsening or not improving.

## 2023-01-15 LAB — SARS CORONAVIRUS 2 (TAT 6-24 HRS): SARS Coronavirus 2: NEGATIVE

## 2023-01-23 ENCOUNTER — Other Ambulatory Visit: Payer: Self-pay | Admitting: Family Medicine

## 2023-01-23 NOTE — Telephone Encounter (Signed)
Lvm for patient tcb and schedule

## 2023-01-23 NOTE — Telephone Encounter (Signed)
Patient is due for follow up or CPE; please call to schedule whichever one patient prefers.

## 2023-01-24 NOTE — Telephone Encounter (Signed)
Patient will callback on Monday to schedule. [place] beach [place].

## 2023-01-29 NOTE — Telephone Encounter (Signed)
LVM for patient to call back and schedule and sent MyChart message.

## 2023-02-22 ENCOUNTER — Other Ambulatory Visit: Payer: Self-pay | Admitting: Family Medicine

## 2023-02-23 ENCOUNTER — Other Ambulatory Visit: Payer: Self-pay | Admitting: Family Medicine

## 2023-02-23 DIAGNOSIS — Z13220 Encounter for screening for lipoid disorders: Secondary | ICD-10-CM

## 2023-02-23 DIAGNOSIS — F339 Major depressive disorder, recurrent, unspecified: Secondary | ICD-10-CM

## 2023-02-24 NOTE — Telephone Encounter (Signed)
Refill request for ARIPIPRAZOLE 5 MG TABLET   LOV - 02/08/22 Next OV - 03/07/23 Last refill - 01/29/23 #30/0

## 2023-02-28 ENCOUNTER — Other Ambulatory Visit (INDEPENDENT_AMBULATORY_CARE_PROVIDER_SITE_OTHER): Payer: BC Managed Care – PPO

## 2023-02-28 DIAGNOSIS — Z13220 Encounter for screening for lipoid disorders: Secondary | ICD-10-CM

## 2023-02-28 DIAGNOSIS — F339 Major depressive disorder, recurrent, unspecified: Secondary | ICD-10-CM | POA: Diagnosis not present

## 2023-02-28 LAB — COMPREHENSIVE METABOLIC PANEL
ALT: 57 U/L — ABNORMAL HIGH (ref 0–53)
AST: 27 U/L (ref 0–37)
Albumin: 4.9 g/dL (ref 3.5–5.2)
Alkaline Phosphatase: 57 U/L (ref 39–117)
BUN: 13 mg/dL (ref 6–23)
CO2: 31 mEq/L (ref 19–32)
Calcium: 9.9 mg/dL (ref 8.4–10.5)
Chloride: 101 mEq/L (ref 96–112)
Creatinine, Ser: 1.38 mg/dL (ref 0.40–1.50)
GFR: 64.3 mL/min (ref >60.00)
Glucose, Bld: 91 mg/dL (ref 70–99)
Potassium: 4.5 mEq/L (ref 3.5–5.1)
Sodium: 141 mEq/L (ref 135–145)
Total Bilirubin: 0.8 mg/dL (ref 0.2–1.2)
Total Protein: 7.5 g/dL (ref 6.0–8.3)

## 2023-02-28 LAB — LIPID PANEL
Cholesterol: 219 mg/dL — ABNORMAL HIGH (ref 0–200)
HDL: 38.9 mg/dL — ABNORMAL LOW (ref >39.00)
LDL Cholesterol: 151 mg/dL — ABNORMAL HIGH (ref 0–99)
NonHDL: 179.91
Total CHOL/HDL Ratio: 6
Triglycerides: 144 mg/dL (ref 0.0–149.0)
VLDL: 28.8 mg/dL (ref 0.0–40.0)

## 2023-03-07 ENCOUNTER — Ambulatory Visit (INDEPENDENT_AMBULATORY_CARE_PROVIDER_SITE_OTHER): Payer: BC Managed Care – PPO | Admitting: Family Medicine

## 2023-03-07 ENCOUNTER — Encounter: Payer: Self-pay | Admitting: Family Medicine

## 2023-03-07 VITALS — BP 132/70 | HR 87 | Temp 97.3°F | Ht 69.0 in | Wt 274.0 lb

## 2023-03-07 DIAGNOSIS — R251 Tremor, unspecified: Secondary | ICD-10-CM

## 2023-03-07 DIAGNOSIS — Z0000 Encounter for general adult medical examination without abnormal findings: Secondary | ICD-10-CM | POA: Diagnosis not present

## 2023-03-07 DIAGNOSIS — Z7189 Other specified counseling: Secondary | ICD-10-CM

## 2023-03-07 DIAGNOSIS — F339 Major depressive disorder, recurrent, unspecified: Secondary | ICD-10-CM

## 2023-03-07 LAB — HM HEPATITIS C SCREENING LAB: HM Hepatitis Screen: NEGATIVE

## 2023-03-07 LAB — TSH: TSH: 3.03 u[IU]/mL (ref 0.35–5.50)

## 2023-03-07 LAB — HM HIV SCREENING LAB: HM HIV Screening: NEGATIVE

## 2023-03-07 MED ORDER — BUSPIRONE HCL 5 MG PO TABS: 5.0000 mg | ORAL_TABLET | Freq: Two times a day (BID) | ORAL | Status: DC

## 2023-03-07 MED ORDER — ARIPIPRAZOLE 5 MG PO TABS: 2.5000 mg | ORAL_TABLET | Freq: Every day | ORAL | Status: DC

## 2023-03-07 MED ORDER — BUSPIRONE HCL 5 MG PO TABS: 5.0000 mg | ORAL_TABLET | Freq: Two times a day (BID) | ORAL | 1 refills | Status: DC

## 2023-03-07 MED ORDER — BUPROPION HCL 100 MG PO TABS: 100.0000 mg | ORAL_TABLET | Freq: Three times a day (TID) | ORAL | 1 refills | Status: DC

## 2023-03-07 NOTE — Progress Notes (Unsigned)
CPE- See plan.  Routine anticipatory guidance given to patient.  See health maintenance.  The possibility exists that previously documented standard health maintenance information may have been brought forward from a previous encounter into this note.  If needed, that same information has been updated to reflect the current situation based on today's encounter.    Tetanus 2013 Flu d/w pt.  Covid prev done.   PNA and shingles not due.   Colon and prostate cancer screening not due.   Living will d/w pt.  Wife designated if patient were incapacitated.   HIV and HCV screening prev done with plasma donation.    Anxiety.  Wife is going through menopause.  His kids are in school, trying to graduate early.  Work stressors d/w pt.  Etoh, 6 pack per night on weekends.  No SI/HI.  He noted tremor, since starting abilify. No resting tremor.  Noted with picking up heavy objects- he had to stop going to the gym.  BUE tremor, not in the BLE.  No ADE on wellbutrin prev.  Abilify helped his mood.  Tremor gradually worse in the last 2 years.  He notified me about this today.  LFT elevation likely related to etoh use, d/w pt.    PMH and SH reviewed  Meds, vitals, and allergies reviewed.   ROS: Per HPI.  Unless specifically indicated otherwise in HPI, the patient denies:  General: fever. Eyes: acute vision changes ENT: sore throat Cardiovascular: chest pain Respiratory: SOB GI: vomiting GU: dysuria Musculoskeletal: acute back pain Derm: acute rash Neuro: acute motor dysfunction Psych: worsening mood Endocrine: polydipsia Heme: bleeding Allergy: hayfever  GEN: nad, alert and oriented HEENT: mucous membranes moist NECK: supple w/o LA CV: rrr. PULM: ctab, no inc wob ABD: soft, +bs EXT: no edema SKIN: no acute rash  Faint L hand tremor with finger to nose testing but not in the R hand.  Faint R>L hand tremor holding a sheet of paper.

## 2023-03-07 NOTE — Patient Instructions (Addendum)
Go to the lab on the way out.   If you have mychart we'll likely use that to update you.    Take care.  Glad to see you. Cut abilify back to 2.5mg  per day.   Start buspar  twice a day. After 5 days, increase to  twice a day.  Let me know how this goes.   Gradually taper alcohol.

## 2023-03-09 DIAGNOSIS — Z7189 Other specified counseling: Secondary | ICD-10-CM | POA: Insufficient documentation

## 2023-03-09 DIAGNOSIS — R251 Tremor, unspecified: Secondary | ICD-10-CM | POA: Insufficient documentation

## 2023-03-09 NOTE — Assessment & Plan Note (Signed)
Living will d/w pt.  Wife designated if patient were incapacitated.   ?

## 2023-03-09 NOTE — Assessment & Plan Note (Signed)
Tremor could be related to Abilify use.  Noted that Abilify helped his mood significantly.  No suicidal or homicidal intent.  Okay for outpatient follow-up.  Discussed options.  Check TSH.  Cut abilify back to 2.5mg  per day.   Start buspar  twice a day. After 5 days, increase to  twice a day.  I asked let me know how he does with his mood in the meantime.  We can refer to neurology if needed regarding tremor.

## 2023-03-09 NOTE — Assessment & Plan Note (Signed)
Tetanus 2013, d/w pt.   Flu d/w pt.  Covid prev done.   PNA and shingles not due.   Colon and prostate cancer screening not due.   Living will d/w pt.  Wife designated if patient were incapacitated.   HIV and HCV screening prev done with plasma donation.

## 2023-03-09 NOTE — Assessment & Plan Note (Signed)
Unclear source.  See depression discussion.

## 2023-03-31 ENCOUNTER — Other Ambulatory Visit: Payer: Self-pay | Admitting: Family Medicine

## 2023-04-05 ENCOUNTER — Other Ambulatory Visit: Payer: Self-pay | Admitting: Family Medicine

## 2023-08-31 ENCOUNTER — Other Ambulatory Visit: Payer: Self-pay | Admitting: Family Medicine

## 2023-10-05 ENCOUNTER — Other Ambulatory Visit: Payer: Self-pay | Admitting: Family Medicine

## 2024-05-23 ENCOUNTER — Other Ambulatory Visit: Payer: Self-pay | Admitting: Family Medicine

## 2024-05-26 ENCOUNTER — Telehealth: Payer: Self-pay | Admitting: Family Medicine

## 2024-05-26 ENCOUNTER — Other Ambulatory Visit: Payer: Self-pay | Admitting: Family Medicine

## 2024-05-26 NOTE — Telephone Encounter (Unsigned)
 Copied from CRM [id number]. Topic: Clinical - Medication Refill >> May 26, 2024  1:19 PM Zenovia J wrote: Medication: busPIRone  (BUSPAR ) 5 MG tablet  Has the patient contacted their pharmacy? Yes (Agent: If no, request that the patient contact the pharmacy for the refill. If patient does not wish to contact the pharmacy document the reason why and proceed with request.) (Agent: If yes, when and what did the pharmacy advise?)  This is the patient's preferred pharmacy:    [phone] [place] [facility] [address] [place] [address] Phone: [phone] Fax: [phone]  Is this the correct pharmacy for this prescription? Yes If no, delete pharmacy and type the correct one.   Has the prescription been filled recently? No  Is the patient out of the medication? No  Has the patient been seen for an appointment in the last year OR does the patient have an upcoming appointment? Yes  Can we respond through MyChart? Yes  Agent: Please be advised that Rx refills may take up to 3 business days. We ask that you follow-up with your pharmacy.

## 2024-05-31 ENCOUNTER — Other Ambulatory Visit: Payer: Self-pay | Admitting: Family Medicine

## 2024-05-31 ENCOUNTER — Ambulatory Visit: Payer: Self-pay

## 2024-05-31 MED ORDER — BUSPIRONE HCL 5 MG PO TABS: 5.0000 mg | ORAL_TABLET | Freq: Two times a day (BID) | ORAL | 1 refills | Status: DC

## 2024-05-31 NOTE — Telephone Encounter (Signed)
 Would keep OV.  Update us  sooner if needed.  If buspar  had been helpful, then would restart, rx sent.  Thanks.

## 2024-05-31 NOTE — Telephone Encounter (Signed)
Patient is scheduled for 7/18

## 2024-05-31 NOTE — Telephone Encounter (Signed)
 FYI Only or Action Required?: Action required by provider: medication refill request.  Patient was last seen in primary care on 03/07/2023 by Jesse Arlyss RAMAN, MD.  Called Nurse Triage reporting Anxiety.  Symptoms began today.  Interventions attempted: Nothing.  Symptoms are: unchanged.  Triage Disposition: See Physician Within 24 Hours-needs a call back in regards to med refill  Patient/caregiver understands and will follow disposition?: No, wishes to speak with PCP  Copied from CRM [id number]. Topic: Clinical - Red Word Triage >> May 31, 2024  3:46 PM Jesse Melton wrote: Red Word that prompted transfer to Nurse Triage: patient calling in has appt for 06/04/24 patient has been out of busPIRone  (BUSPAR ) 5 MG tablet  since Saturday morning, anxiety and depression are ramping up. Reason for Disposition  Patient sounds very upset or troubled to the triager  Answer Assessment - Initial Assessment Questions 1. CONCERN: Did anything happen that prompted you to call today?      Anxiety has gotten out of control starting yesterday and today 2. ANXIETY SYMPTOMS: Can you describe how you (your loved one; patient) have been feeling? (e.g., tense, restless, panicky, anxious, keyed up, overwhelmed, sense of impending doom).      Trouble going to sleep, overwhelmed, overthinking 3. ONSET: How long have you been feeling this way? (e.g., hours, days, weeks)     Started yesterday after running out of Buspar .  4. SEVERITY: How would you rate the level of anxiety? (e.g., 0 - 10; or mild, moderate, severe).     Severe 5. FUNCTIONAL IMPAIRMENT: How have these feelings affected your ability to do daily activities? Have you had more difficulty than usual doing your normal daily activities? (e.g., getting better, same, worse; self-care, school, work, interactions)     yes 6. HISTORY: Have you felt this way before? Have you ever been diagnosed with an anxiety problem in the past? (e.g., generalized  anxiety disorder, panic attacks, PTSD). If Yes, ask: How was this problem treated? (e.g., medicines, counseling, etc.)     yes 7. RISK OF HARM - SUICIDAL IDEATION: Do you ever have thoughts of hurting or killing yourself? If Yes, ask:  Do you have these feelings now? Do you have a plan on how you would do this?     no 8. TREATMENT:  What has been done so far to treat this anxiety? (e.g., medicines, relaxation strategies). What has helped?     medications 9. THERAPIST: Do you have a counselor or therapist? If Yes, ask: What is their name?     N/a 10. POTENTIAL TRIGGERS: Do you drink caffeinated beverages (e.g., coffee, colas, teas), and how much daily? Do you drink alcohol or use any drugs? Have you started any new medicines recently?       no 11. PATIENT SUPPORT: Who is with you now? Who do you live with? Do you have family or friends who you can talk to?        Family at home 12. OTHER SYMPTOMS: Do you have any other symptoms? (e.g., feeling depressed, trouble concentrating, trouble sleeping, trouble breathing, palpitations or fast heartbeat, chest pain, sweating, nausea, or diarrhea)       Trouble concentrating.   Patient is requesting to get his Buspar  refilled. Patient has been out since Saturday morning. Patient endorses anxiety and depression going up. Patient is scheduled for an appointment on 06/04/2024. Patient is asking for these medications to be filled prior to his appointment. [facility] given [facility] patient  can get his prescriptions filled urgently. Patient indicated that he works nights and would be leaving for work in an hour and a half. Patient states he would like the medication filled tomorrow.  Protocols used: Anxiety and Panic Attack-A-AH

## 2024-06-01 NOTE — Telephone Encounter (Signed)
 Left voicemail for patient to return call to office.

## 2024-06-01 NOTE — Telephone Encounter (Unsigned)
 Copied from CRM [id number]. Topic: General - Other >> Jun 01, 2024  1:52 PM [name] DEL wrote: Reason for CRM: Patient returned call to [facility] regarding medication. I informed patient that rx was sent to pharmacy and that provider stated that e would see him on 06/04/2024 for his OV.

## 2024-06-04 ENCOUNTER — Other Ambulatory Visit (HOSPITAL_COMMUNITY): Payer: Self-pay

## 2024-06-04 ENCOUNTER — Ambulatory Visit: Admitting: Family Medicine

## 2024-06-04 ENCOUNTER — Telehealth: Payer: Self-pay

## 2024-06-04 ENCOUNTER — Encounter: Payer: Self-pay | Admitting: Family Medicine

## 2024-06-04 ENCOUNTER — Ambulatory Visit: Payer: Self-pay | Admitting: Family Medicine

## 2024-06-04 VITALS — BP 136/82 | HR 97 | Temp 98.3°F | Ht 69.0 in | Wt 268.8 lb

## 2024-06-04 DIAGNOSIS — H9190 Unspecified hearing loss, unspecified ear: Secondary | ICD-10-CM | POA: Insufficient documentation

## 2024-06-04 DIAGNOSIS — E785 Hyperlipidemia, unspecified: Secondary | ICD-10-CM

## 2024-06-04 DIAGNOSIS — Z23 Encounter for immunization: Secondary | ICD-10-CM | POA: Diagnosis not present

## 2024-06-04 DIAGNOSIS — N529 Male erectile dysfunction, unspecified: Secondary | ICD-10-CM

## 2024-06-04 DIAGNOSIS — Z0000 Encounter for general adult medical examination without abnormal findings: Secondary | ICD-10-CM | POA: Diagnosis not present

## 2024-06-04 DIAGNOSIS — F339 Major depressive disorder, recurrent, unspecified: Secondary | ICD-10-CM

## 2024-06-04 DIAGNOSIS — Z7189 Other specified counseling: Secondary | ICD-10-CM

## 2024-06-04 LAB — COMPREHENSIVE METABOLIC PANEL WITH GFR
ALT: 37 U/L (ref 0–53)
AST: 23 U/L (ref 0–37)
Albumin: 4.7 g/dL (ref 3.5–5.2)
Alkaline Phosphatase: 59 U/L (ref 39–117)
BUN: 15 mg/dL (ref 6–23)
CO2: 27 meq/L (ref 19–32)
Calcium: 9.3 mg/dL (ref 8.4–10.5)
Chloride: 102 meq/L (ref 96–112)
Creatinine, Ser: 1.2 mg/dL (ref 0.40–1.50)
GFR: 75.37 mL/min (ref >60.00)
Glucose, Bld: 91 mg/dL (ref 70–99)
Potassium: 4 meq/L (ref 3.5–5.1)
Sodium: 138 meq/L (ref 135–145)
Total Bilirubin: 0.6 mg/dL (ref 0.2–1.2)
Total Protein: 7.1 g/dL (ref 6.0–8.3)

## 2024-06-04 LAB — LIPID PANEL
Cholesterol: 251 mg/dL — ABNORMAL HIGH (ref 0–200)
HDL: 40.4 mg/dL (ref >39.00)
LDL Cholesterol: 175 mg/dL — ABNORMAL HIGH (ref 0–99)
NonHDL: 210.64
Total CHOL/HDL Ratio: 6
Triglycerides: 178 mg/dL — ABNORMAL HIGH (ref 0.0–149.0)
VLDL: 35.6 mg/dL (ref 0.0–40.0)

## 2024-06-04 MED ORDER — SILDENAFIL CITRATE 20 MG PO TABS: 20.0000 mg | ORAL_TABLET | Freq: Every day | ORAL | 12 refills | Status: AC | PRN

## 2024-06-04 MED ORDER — BUPROPION HCL 100 MG PO TABS: 100.0000 mg | ORAL_TABLET | Freq: Three times a day (TID) | ORAL | 3 refills | Status: AC

## 2024-06-04 MED ORDER — BUSPIRONE HCL 10 MG PO TABS: 10.0000 mg | ORAL_TABLET | Freq: Two times a day (BID) | ORAL | 3 refills | Status: AC

## 2024-06-04 NOTE — Telephone Encounter (Signed)
 Pharmacy Patient Advocate Encounter   Received notification from Onbase that prior authorization for Sildenafil  20 is required/requested.   Insurance verification completed.   The patient is insured through [facility] .   Per test claim: PA required; PA submitted to above mentioned insurance via CoverMyMeds Key/confirmation #/EOC [id number] Status is pending

## 2024-06-04 NOTE — Assessment & Plan Note (Signed)
 He can let me know if he wants audiology referral.

## 2024-06-04 NOTE — Progress Notes (Signed)
 CPE- See plan.  Routine anticipatory guidance given to patient.  See health maintenance.  The possibility exists that previously documented standard health maintenance information may have been brought forward from a previous encounter into this note.  If needed, that same information has been updated to reflect the current situation based on today's encounter.     Tetanus 2025 Flu d/w pt.  Covid prev done.   PNA and shingles not due.   Colon and prostate cancer screening not due.   Living will d/w pt.  Wife designated if patient were incapacitated.   HIV and HCV screening prev done with plasma donation.     Mood d/w pt.  Working nights at baseline.  Taking buspar  10mg  and was clearly helpful.  He noted a change off med.  Restarted in the meantime.  Anxiety is better now.  No SI/HI.  Still on wellbutrin  3 time per day.  He had tremor with abilify , off med now.  No resting tremor but has tremor with heavier lifting.  He didn't think tremor was worse.  I asked him to update me as needed.  He does drink caffeine daily.  Discussed attempting caffeine taper.   We talked about potential ADD testing.  He can let me know if he wants to pursue that.  He is using counseling through work.     Sildenafil  helped, no ADE on med.  Rx sent.     TM wnl B.  He noted some hearing loss.  H/o noise exposure.  He'll consider about audiology eval. he can update me as needed.  Labs pending.   PMH and SH reviewed   Meds, vitals, and allergies reviewed.    ROS: Per HPI.  Unless specifically indicated otherwise in HPI, the patient denies:   General: fever. Eyes: acute vision changes ENT: sore throat Cardiovascular: chest pain Respiratory: SOB GI: vomiting GU: dysuria Musculoskeletal: acute back pain Derm: acute rash Neuro: acute motor dysfunction Psych: worsening mood Endocrine: polydipsia Heme: bleeding Allergy: hayfever   GEN: nad, alert and oriented HEENT: mucous membranes moist, tympanic membranes  and ear canals normal bilaterally. NECK: supple w/o LA CV: rrr. PULM: ctab, no inc wob ABD: soft, +bs EXT: no edema SKIN: no acute rash

## 2024-06-04 NOTE — Assessment & Plan Note (Signed)
Continue as needed sildenafil. 

## 2024-06-04 NOTE — Patient Instructions (Signed)
 If you notice the tremor is worse, then let me know.   Let me know if you want to see audiology.  Tetanus shot today.  Go to the lab on the way out.   If you have mychart we'll likely use that to update you.    Take care.  Glad to see you. I would get a flu shot each fall.

## 2024-06-04 NOTE — Telephone Encounter (Signed)
 Patient notified via mychart

## 2024-06-04 NOTE — Progress Notes (Signed)
 Opened in error. See other note.

## 2024-06-04 NOTE — Assessment & Plan Note (Addendum)
 Taking buspar  10mg  and was clearly helpful.  He noted a change off med.  Restarted in the meantime.  Anxiety is better now.  No SI/HI.  Still on wellbutrin  3 time per day.  He had tremor with abilify , off med now.  No resting tremor but has tremor with heavier lifting.  He didn't think tremor was worse.  I asked him to update me as needed.  He does drink caffeine daily.  Discussed attempting caffeine taper.  Continue BuSpar  and Wellbutrin .  We talked about potential ADD testing.  He can let me know if he wants to pursue that.  He is using counseling through work.

## 2024-06-04 NOTE — Telephone Encounter (Signed)
 Pharmacy Patient Advocate Encounter  Received notification from [facility] that Prior Authorization for Sildenafil  20 has been DENIED.  Full denial letter will be uploaded to the media tab. See denial reason below.   PA #/Case ID/Reference #: [id number]

## 2024-06-04 NOTE — Telephone Encounter (Signed)
 D/w pt at OV that this likely wouldn't be covered. He understood.  Thanks.

## 2024-06-04 NOTE — Patient Instructions (Addendum)
 If you notice the tremor is worse, then let me know.   Let me know if you want to see audiology.  Tetanus shot today.  Go to the lab on the way out.   If you have mychart we'll likely use that to update you.    Take care.  Glad to see you. I would get a flu shot each fall.

## 2024-06-04 NOTE — Assessment & Plan Note (Signed)
 Tetanus 2025 Flu d/w pt.  Covid prev done.   PNA and shingles not due.   Colon and prostate cancer screening not due.   Living will d/w pt.  Wife designated if patient were incapacitated.   HIV and HCV screening prev done with plasma donation.

## 2024-06-04 NOTE — Assessment & Plan Note (Signed)
 Living will d/w pt.  Wife designated if patient were incapacitated.   ?
